# Patient Record
Sex: Female | Born: 1960 | Race: Black or African American | Hispanic: No | Marital: Single | Smoking: Current every day smoker
Health system: Southern US, Community
[De-identification: ages and names within clinical notes are randomized; demographics above are authoritative.]

## PROBLEM LIST (undated history)

## (undated) DIAGNOSIS — D509 Iron deficiency anemia, unspecified: Secondary | ICD-10-CM

## (undated) DIAGNOSIS — F419 Anxiety disorder, unspecified: Secondary | ICD-10-CM

## (undated) DIAGNOSIS — Z923 Personal history of irradiation: Secondary | ICD-10-CM

## (undated) DIAGNOSIS — K219 Gastro-esophageal reflux disease without esophagitis: Secondary | ICD-10-CM

## (undated) DIAGNOSIS — D0511 Intraductal carcinoma in situ of right breast: Secondary | ICD-10-CM

## (undated) DIAGNOSIS — F32A Depression, unspecified: Secondary | ICD-10-CM

## (undated) DIAGNOSIS — F329 Major depressive disorder, single episode, unspecified: Secondary | ICD-10-CM

## (undated) DIAGNOSIS — C50919 Malignant neoplasm of unspecified site of unspecified female breast: Secondary | ICD-10-CM

## (undated) DIAGNOSIS — J381 Polyp of vocal cord and larynx: Secondary | ICD-10-CM

## (undated) HISTORY — DX: Polyp of vocal cord and larynx: J38.1

## (undated) HISTORY — PX: BREAST BIOPSY: SHX20

## (undated) HISTORY — DX: Intraductal carcinoma in situ of right breast: D05.11

## (undated) HISTORY — DX: Gastro-esophageal reflux disease without esophagitis: K21.9

## (undated) HISTORY — DX: Iron deficiency anemia, unspecified: D50.9

## (undated) HISTORY — DX: Depression, unspecified: F32.A

## (undated) HISTORY — DX: Major depressive disorder, single episode, unspecified: F32.9

---

## 2006-12-27 ENCOUNTER — Ambulatory Visit: Payer: Self-pay | Admitting: Endocrinology

## 2007-01-13 ENCOUNTER — Ambulatory Visit: Payer: Self-pay | Admitting: Endocrinology

## 2007-10-28 IMAGING — MG UNKNOWN MG STUDY
1 series · 8 of 8 positions shown · non-contrast
Comparison: none

REASON FOR EXAM: Right breast calcifications
COMMENTS:

[R XCCL · right · 8 of 11 slices shown]
[im 1/11]
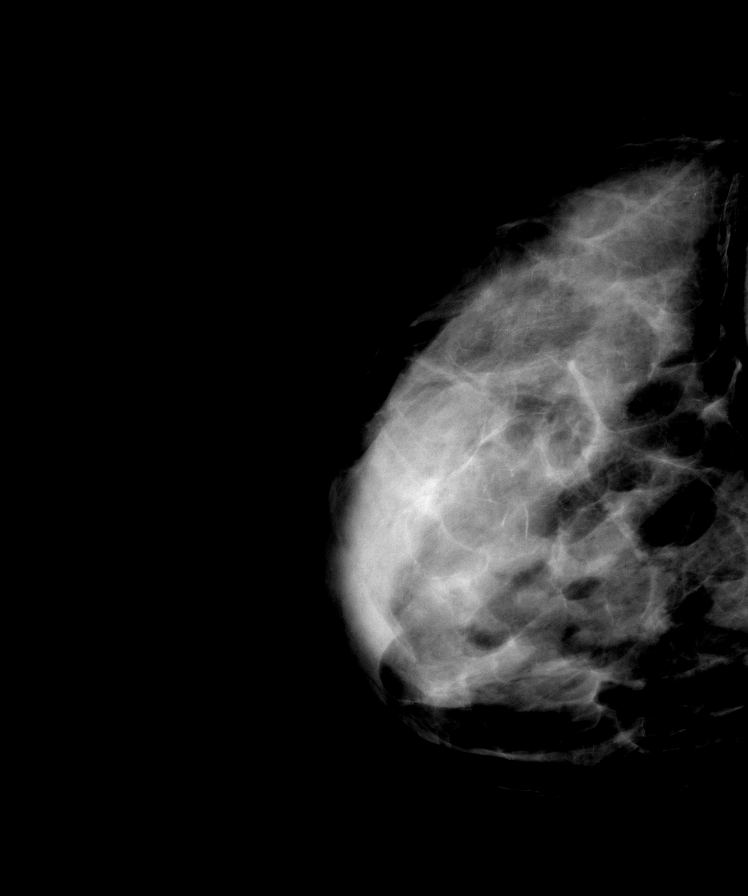
[im 2/11]
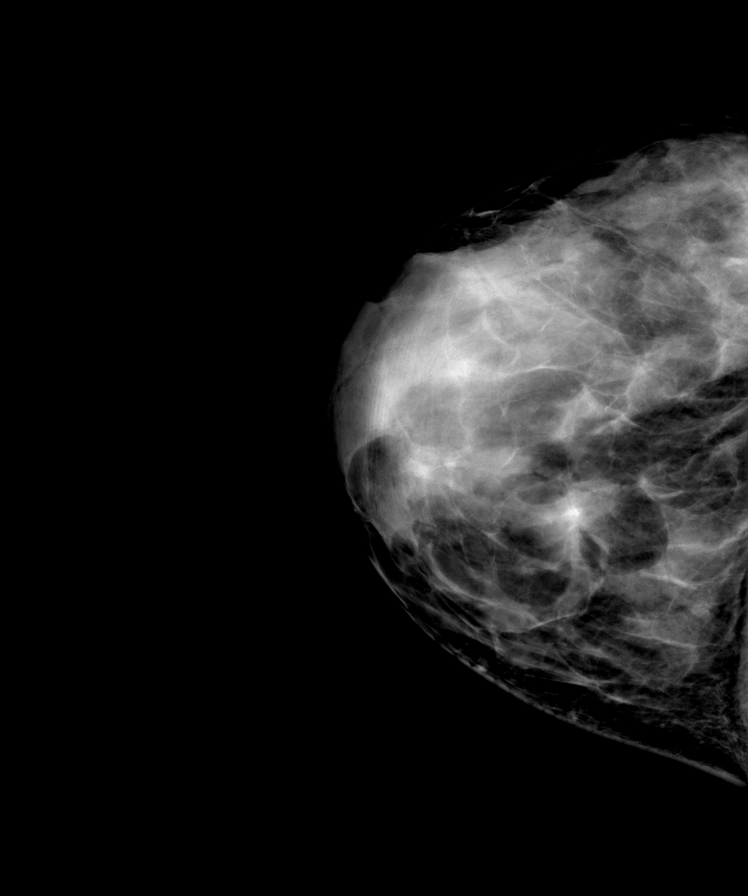
[im 3/11]
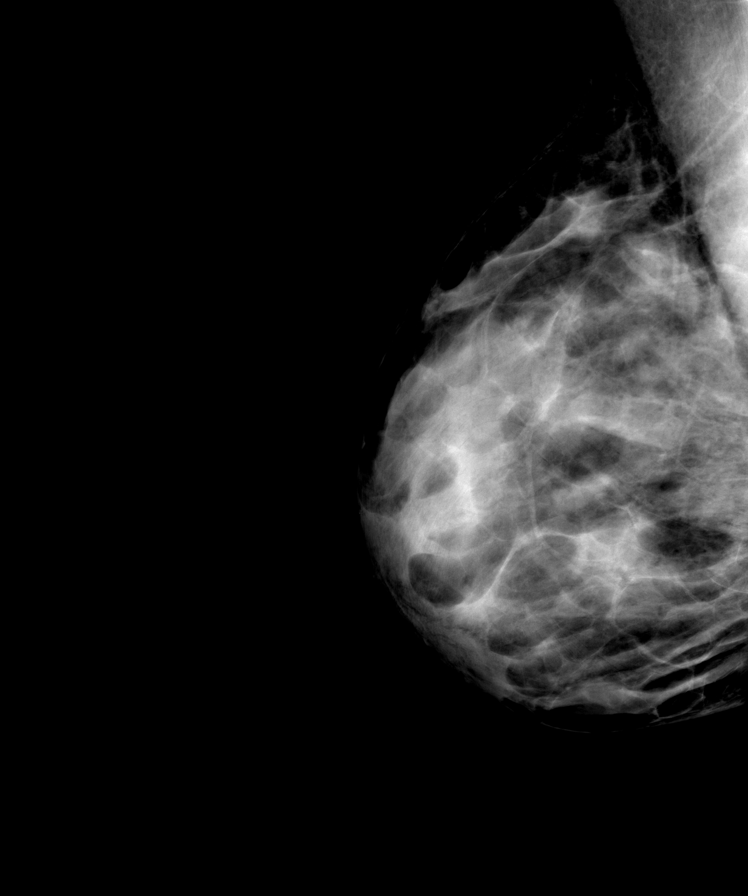
[im 5/11]
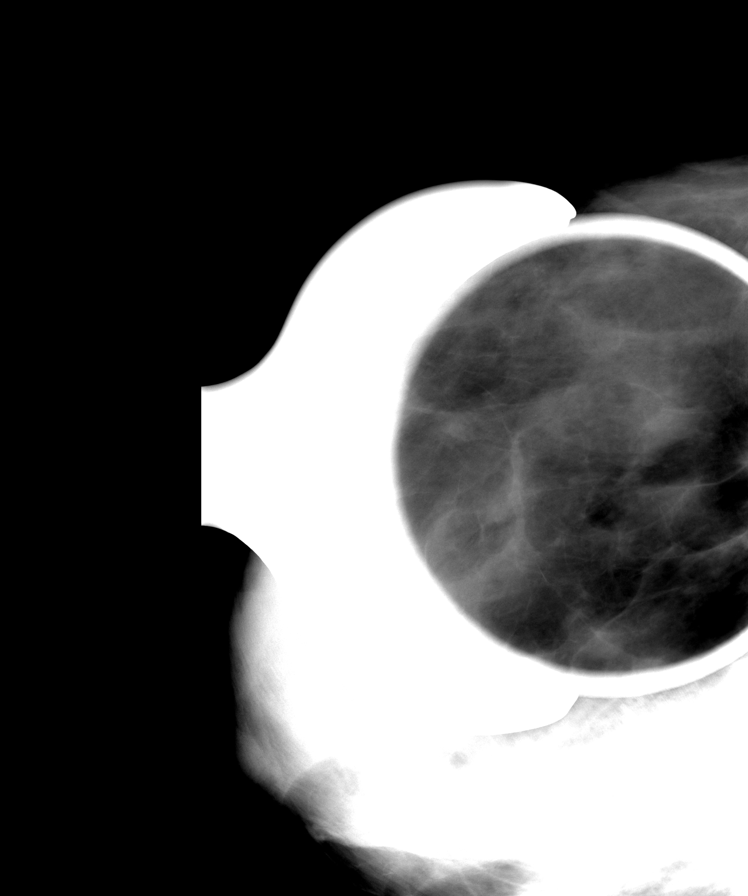
[im 6/11]
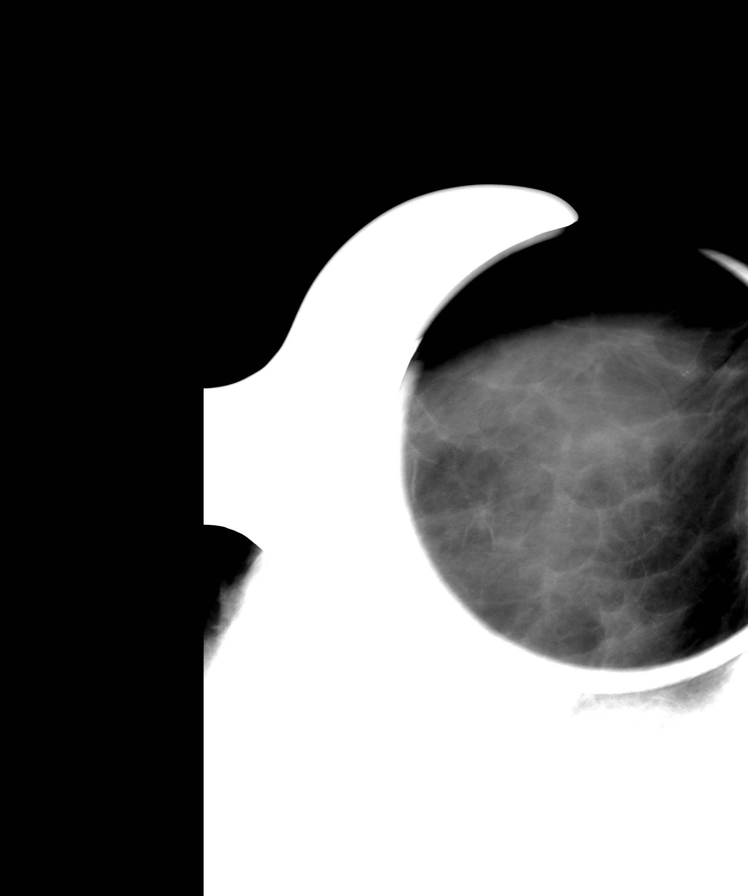
[im 8/11]
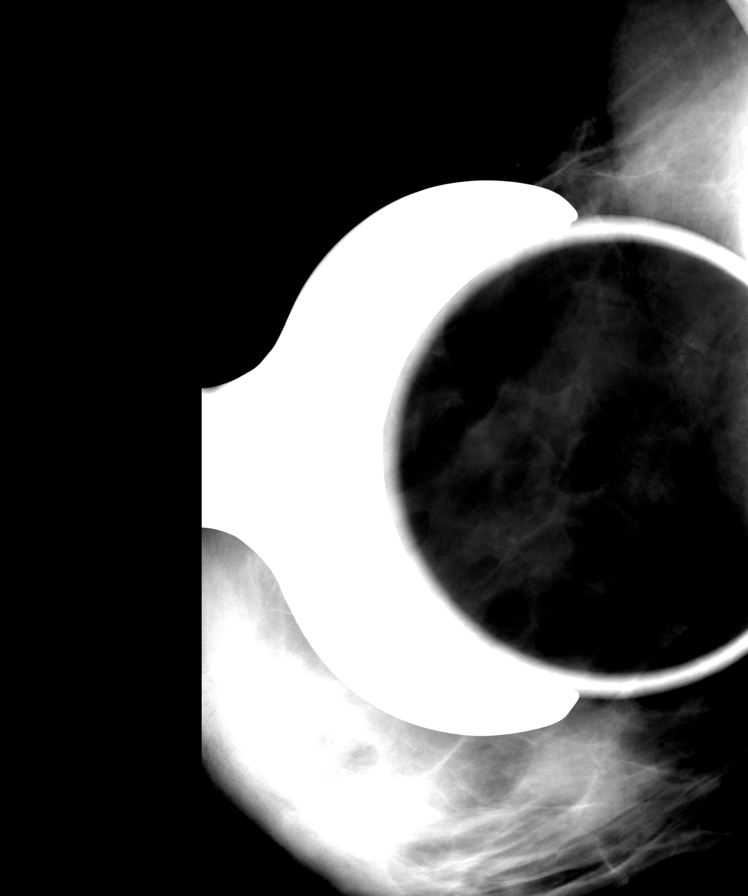
[im 9/11]
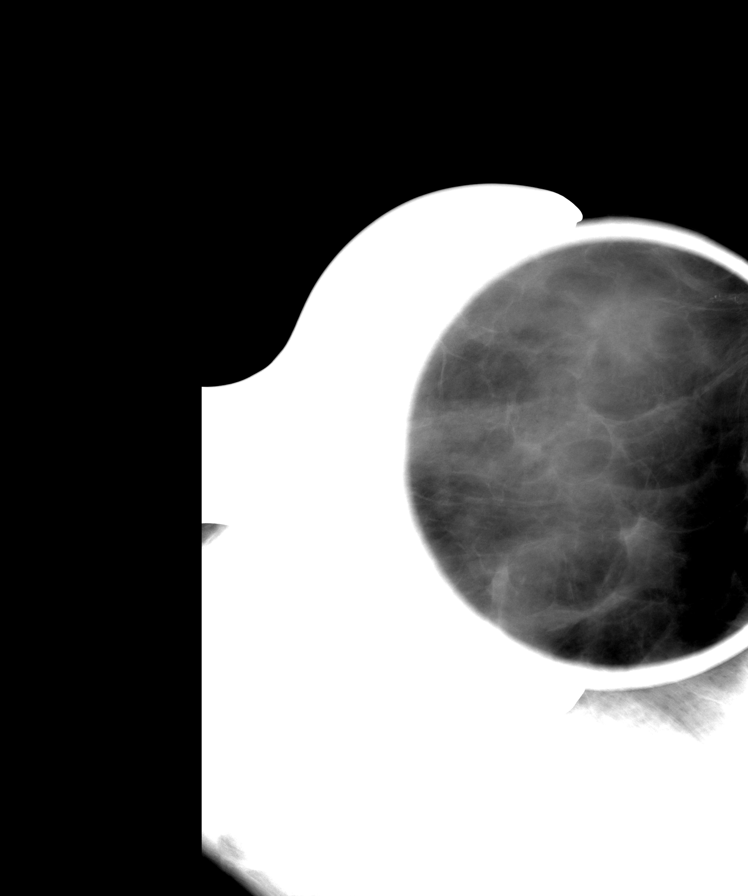
[im 11/11]
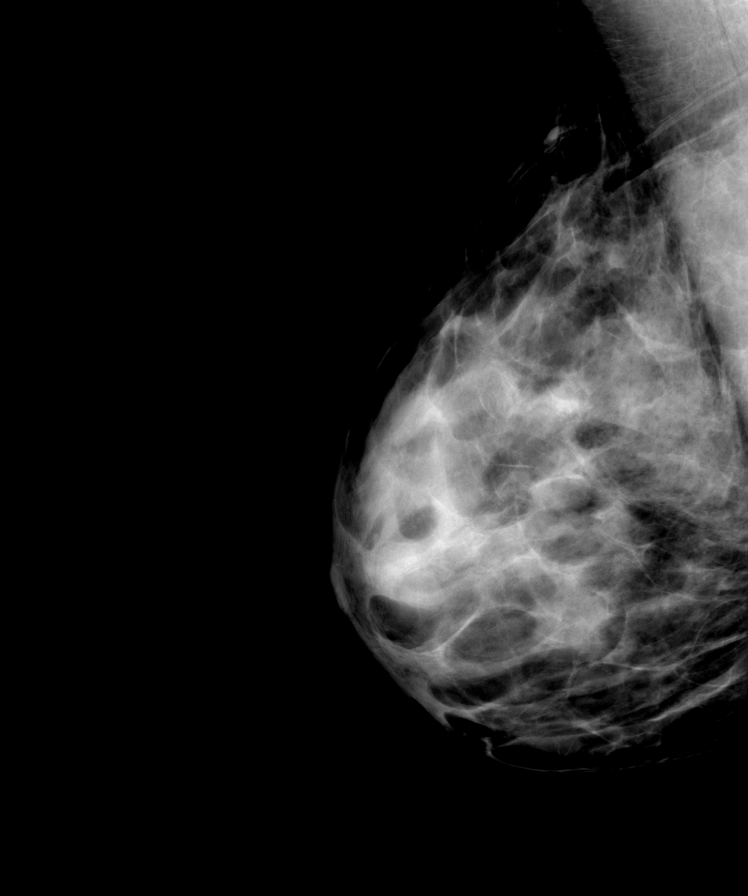

[8 of 8 positions shown; findings below may reference images not displayed]

PROCEDURE:     MAM - MAM DIG ADDVIEWS RT SCR  - January 13, 2007  [DATE]

RESULT:     On the patient's screening study [DATE] faint
microcalcifications were seen on the MLO view only projecting over the
pectoral muscle. On a true lateral film today, these are faintly visible. A
spot compression MLO view does reveal these to good advantage. On the CC
view, the calcifications are demonstrated quite laterally on spot films and
on an exaggerated film.
IMPRESSION: In the very lateral upper aspect of the RIGHT breast, there
are indeterminate microcalcifications. They are indeterminate in nature.
Surgical consultation is recommended.

BI-RADS: Category 4 - Suspicious Abnormality. Surgical consultation with an
eye toward either close interval three to six month follow-up mammography
versus wire localization and open biopsy is recommended. The
microcalcifications are too deep to be considered for Stereotactic Biopsy.

Thank you for this opportunity to contribute to the care of your patient.

A NEGATIVE MAMMOGRAM REPORT DOES NOT PRECLUDE BIOPSY OR OTHER EVALUATION OF
A CLINICALLY PALPABLE OR OTHERWISE SUSPICIOUS MASS OR LESION. BREAST CANCER
MAY NOT BE DETECTED BY MAMMOGRAPHY IN UP TO 10% OF CASES.

## 2007-12-01 HISTORY — PX: UPPER GI ENDOSCOPY: SHX6162

## 2010-08-26 DIAGNOSIS — K219 Gastro-esophageal reflux disease without esophagitis: Secondary | ICD-10-CM

## 2010-08-26 HISTORY — DX: Gastro-esophageal reflux disease without esophagitis: K21.9

## 2015-12-01 HISTORY — PX: COLONOSCOPY: SHX174

## 2016-03-16 ENCOUNTER — Other Ambulatory Visit: Payer: Self-pay | Admitting: Family Medicine

## 2016-03-16 DIAGNOSIS — R922 Inconclusive mammogram: Secondary | ICD-10-CM

## 2016-04-08 ENCOUNTER — Other Ambulatory Visit: Payer: Self-pay

## 2016-04-08 ENCOUNTER — Ambulatory Visit: Payer: Self-pay

## 2016-04-17 ENCOUNTER — Ambulatory Visit
Admission: RE | Admit: 2016-04-17 | Discharge: 2016-04-17 | Disposition: A | Discharge status: Home / Self Care | Payer: BLUE CROSS/BLUE SHIELD | Source: Ambulatory Visit | Attending: Family Medicine | Admitting: Family Medicine

## 2016-04-17 DIAGNOSIS — R922 Inconclusive mammogram: Secondary | ICD-10-CM | POA: Diagnosis present

## 2016-05-29 ENCOUNTER — Ambulatory Visit
Admission: RE | Admit: 2016-05-29 | Discharge: 2016-05-29 | Disposition: A | Discharge status: Home Health | Payer: BLUE CROSS/BLUE SHIELD | Source: Ambulatory Visit | Attending: Gastroenterology | Admitting: Gastroenterology

## 2016-05-29 ENCOUNTER — Ambulatory Visit: Payer: BLUE CROSS/BLUE SHIELD | Admitting: Anesthesiology

## 2016-05-29 ENCOUNTER — Encounter
Admission: RE | Disposition: A | Discharge status: Home Health | Payer: Self-pay | Source: Ambulatory Visit | Attending: Gastroenterology

## 2016-05-29 ENCOUNTER — Encounter: Payer: Self-pay | Admitting: *Deleted

## 2016-05-29 DIAGNOSIS — K621 Rectal polyp: Secondary | ICD-10-CM | POA: Insufficient documentation

## 2016-05-29 DIAGNOSIS — Z885 Allergy status to narcotic agent status: Secondary | ICD-10-CM | POA: Diagnosis not present

## 2016-05-29 DIAGNOSIS — K5730 Diverticulosis of large intestine without perforation or abscess without bleeding: Secondary | ICD-10-CM | POA: Diagnosis not present

## 2016-05-29 DIAGNOSIS — K635 Polyp of colon: Secondary | ICD-10-CM | POA: Diagnosis not present

## 2016-05-29 DIAGNOSIS — Z1211 Encounter for screening for malignant neoplasm of colon: Secondary | ICD-10-CM | POA: Insufficient documentation

## 2016-05-29 DIAGNOSIS — D125 Benign neoplasm of sigmoid colon: Secondary | ICD-10-CM | POA: Insufficient documentation

## 2016-05-29 DIAGNOSIS — F17200 Nicotine dependence, unspecified, uncomplicated: Secondary | ICD-10-CM | POA: Insufficient documentation

## 2016-05-29 SURGERY — COLONOSCOPY WITH PROPOFOL
Anesthesia: General

## 2016-05-29 MED ORDER — PROPOFOL 500 MG/50ML IV EMUL
INTRAVENOUS | Status: DC | PRN
Start: 2016-05-29 — End: 2016-05-29
  Administered 2016-05-29: 140 ug/kg/min via INTRAVENOUS

## 2016-05-29 MED ORDER — MIDAZOLAM HCL 2 MG/2ML IJ SOLN
INTRAMUSCULAR | Status: DC | PRN
  Administered 2016-05-29: 1 mg via INTRAVENOUS

## 2016-05-29 MED ORDER — SODIUM CHLORIDE 0.9 % IV SOLN
INTRAVENOUS | Status: DC
  Administered 2016-05-29: 1000 mL via INTRAVENOUS

## 2016-05-29 MED ORDER — PROPOFOL 10 MG/ML IV BOLUS
INTRAVENOUS | Status: DC | PRN
  Administered 2016-05-29: 50 mg via INTRAVENOUS

## 2016-05-29 MED ORDER — FENTANYL CITRATE (PF) 100 MCG/2ML IJ SOLN
INTRAMUSCULAR | Status: DC | PRN
  Administered 2016-05-29: 50 ug via INTRAVENOUS

## 2016-05-29 MED ORDER — SODIUM CHLORIDE 0.9 % IV SOLN
INTRAVENOUS | Status: DC
Start: 2016-05-29 — End: 2016-05-29

## 2016-05-29 MED ORDER — LIDOCAINE 2% (20 MG/ML) 5 ML SYRINGE
INTRAMUSCULAR | Status: DC | PRN
  Administered 2016-05-29: 40 mg via INTRAVENOUS

## 2016-05-29 NOTE — H&P (Signed)
Outpatient short stay form Pre-procedure 05/29/2016 12:53 PM Lollie Sails MD  Primary Physician: Dr. Clide Deutscher  Reason for visit:  Screening colonoscopy  History of present illness:  Patient is a 55 year old female presenting today as above. She did tolerate her prep well. She takes no aspirin or blood thinning products.    Current facility-administered medications:  .  0.9 %  sodium chloride infusion, , Intravenous, Continuous, Lollie Sails, MD .  0.9 %  sodium chloride infusion, , Intravenous, Continuous, Lollie Sails, MD  No prescriptions prior to admission     Allergies  Allergen Reactions  . Morphine And Related      History reviewed. No pertinent past medical history.  Review of systems:      Physical Exam    Heart and lungs: Regular rate and rhythm without rub or gallop, lungs are bilaterally clear.    HEENT: Norm cephalic atraumatic eyes are anicteric    Other:     Pertinant exam for procedure: Nontender nondistended bowel sounds positive normoactive.    Planned proceedures: Colonoscopy and indicated procedures. I have discussed the risks benefits and complications of procedures to include not limited to bleeding, infection, perforation and the risk of sedation and the patient wishes to proceed.    Lollie Sails, MD Gastroenterology 05/29/2016  12:53 PM

## 2016-05-29 NOTE — Op Note (Signed)
[facility] Gastroenterology Patient Name: Jennifer Rowe Procedure Date: 05/29/2016 1:00 PM MRN: [id number] Account #: [id number] Date of Birth: [birth year] Admit Type: Outpatient Age: 55 Room: [facility] ENDO ROOM 1 Gender: Female Note Status: Finalized Procedure:            Colonoscopy Indications:          Screening for colorectal malignant neoplasm, This is                        the patient's first colonoscopy Providers:            Lollie Sails, MD Referring MD:         Edmonia Lynch. Aycock MD (Referring MD) Medicines:            Monitored Anesthesia Care Complications:        No immediate complications. Procedure:            Pre-Anesthesia Assessment:                       - ASA Grade Assessment: II - A patient with mild                        systemic disease.                       After obtaining informed consent, the colonoscope was                        passed under direct vision. Throughout the procedure,                        the patient's blood pressure, pulse, and oxygen                        saturations were monitored continuously. The                        Colonoscope was introduced through the anus and                        advanced to the the cecum, identified by appendiceal                        orifice and ileocecal valve. The colonoscopy was                        performed with moderate difficulty due to a tortuous                        colon. Successful completion of the procedure was aided                        by using manual pressure. The patient tolerated the                        procedure well. The quality of the bowel preparation                        was good. Findings:      A few small-mouthed diverticula were found in the sigmoid colon and  distal descending colon.      A 2 mm polyp was found in the rectum. The polyp was sessile. The polyp       was removed with a cold biopsy forceps. Resection and retrieval were   complete.      A 3 mm polyp was found in the distal sigmoid colon. The polyp was       sessile. The polyp was removed with a cold snare. Resection and       retrieval were complete.      Four sessile polyps were found in the distal sigmoid colon. The polyps       were 1 to 2 mm in size. These polyps were removed with a cold biopsy       forceps. Resection and retrieval were complete.      The retroflexed view of the distal rectum and anal verge was normal and       showed no anal or rectal abnormalities.      The digital rectal exam was normal. Impression:           - Diverticulosis in the sigmoid colon and in the distal                        descending colon.                       - One 2 mm polyp in the rectum, removed with a cold                        biopsy forceps. Resected and retrieved.                       - One 3 mm polyp in the distal sigmoid colon, removed                        with a cold snare. Resected and retrieved.                       - Four 1 to 2 mm polyps in the distal sigmoid colon,                        removed with a cold biopsy forceps. Resected and                        retrieved.                       - The distal rectum and anal verge are normal on                        retroflexion view. Recommendation:       - Discharge patient to home.                       - Telephone GI clinic for pathology results in 1 week. Procedure Code(s):    --- Professional ---                       [phone], Colonoscopy, flexible; with removal of tumor(s),  polyp(s), or other lesion(s) by snare technique                       [id number], 59, Colonoscopy, flexible; with biopsy, single                        or multiple Diagnosis Code(s):    --- Professional ---                       Z12.11, Encounter for screening for malignant neoplasm                        of colon                       K62.1, Rectal polyp                       D12.5, Benign neoplasm of  sigmoid colon                       K57.30, Diverticulosis of large intestine without                        perforation or abscess without bleeding CPT copyright 2016 American Medical Association. All rights reserved. The codes documented in this report are preliminary and upon coder review may  be revised to meet current compliance requirements. Lollie Sails, MD 05/29/2016 1:37:17 PM This report has been signed electronically. Number of Addenda: 0 Note Initiated On: 05/29/2016 1:00 PM Scope Withdrawal Time: 0 hours 10 minutes 9 seconds  Total Procedure Duration: 0 hours 24 minutes 35 seconds       [facility]

## 2016-05-29 NOTE — Anesthesia Preprocedure Evaluation (Signed)
Anesthesia Evaluation  Patient identified by MRN, date of birth, ID band Patient awake    Reviewed: Allergy & Precautions, NPO status , Patient's Chart, lab work & pertinent test results, reviewed documented beta blocker date and time   Airway Mallampati: II  TM Distance: >3 FB     Dental  (+) Chipped   Pulmonary Current Smoker,           Cardiovascular      Neuro/Psych    GI/Hepatic   Endo/Other    Renal/GU      Musculoskeletal   Abdominal   Peds  Hematology   Anesthesia Other Findings   Reproductive/Obstetrics                             Anesthesia Physical Anesthesia Plan  ASA: II  Anesthesia Plan: General   Post-op Pain Management:    Induction: Intravenous  Airway Management Planned: Nasal Cannula  Additional Equipment:   Intra-op Plan:   Post-operative Plan:   Informed Consent: I have reviewed the patients History and Physical, chart, labs and discussed the procedure including the risks, benefits and alternatives for the proposed anesthesia with the patient or authorized representative who has indicated his/her understanding and acceptance.     Plan Discussed with: CRNA  Anesthesia Plan Comments:         Anesthesia Quick Evaluation

## 2016-05-29 NOTE — Anesthesia Procedure Notes (Signed)
Date/Time: 05/29/2016 1:00 PM Performed by: Allean Found Pre-anesthesia Checklist: Patient identified, Emergency Drugs available, Suction available, Patient being monitored and Timeout performed Patient Re-evaluated:Patient Re-evaluated prior to inductionOxygen Delivery Method: Nasal cannula Intubation Type: IV induction

## 2016-05-29 NOTE — Anesthesia Postprocedure Evaluation (Signed)
Anesthesia Post Note  Patient: Jennifer Rowe  Procedure(s) Performed: Procedure(s) (LRB): COLONOSCOPY WITH PROPOFOL (N/A)  Patient location during evaluation: Endoscopy Anesthesia Type: General Level of consciousness: awake and alert Pain management: pain level controlled Vital Signs Assessment: post-procedure vital signs reviewed and stable Respiratory status: spontaneous breathing, nonlabored ventilation, respiratory function stable and patient connected to nasal cannula oxygen Cardiovascular status: blood pressure returned to baseline and stable Postop Assessment: no signs of nausea or vomiting Anesthetic complications: no    Last Vitals:  Filed Vitals:   05/29/16 1401 05/29/16 1411  BP: 142/97 155/95  Pulse: 63 58  Temp:    Resp: 17 14    Last Pain: There were no vitals filed for this visit.               Culley Hedeen S

## 2016-05-29 NOTE — Transfer of Care (Signed)
Immediate Anesthesia Transfer of Care Note  Patient: Jennifer Rowe  Procedure(s) Performed: Procedure(s): COLONOSCOPY WITH PROPOFOL (N/A)  Patient Location: PACU  Anesthesia Type:General  Level of Consciousness: awake  Airway & Oxygen Therapy: Patient Spontanous Breathing and Patient connected to nasal cannula oxygen  Post-op Assessment: Report given to RN and Post -op Vital signs reviewed and stable  Post vital signs: Reviewed and stable  Last Vitals:  Filed Vitals:   05/29/16 1243 05/29/16 1341  BP: 113/49 112/66  Pulse: 93 66  Temp: 37.6 C 35.9 C  Resp: 20 18    Last Pain: There were no vitals filed for this visit.       Complications: No apparent anesthesia complications

## 2016-06-01 LAB — SURGICAL PATHOLOGY

## 2016-08-14 DIAGNOSIS — R196 Halitosis: Secondary | ICD-10-CM | POA: Insufficient documentation

## 2016-08-14 DIAGNOSIS — J381 Polyp of vocal cord and larynx: Secondary | ICD-10-CM | POA: Insufficient documentation

## 2016-08-14 HISTORY — DX: Polyp of vocal cord and larynx: J38.1

## 2017-11-30 DIAGNOSIS — Z923 Personal history of irradiation: Secondary | ICD-10-CM

## 2017-11-30 HISTORY — DX: Personal history of irradiation: Z92.3

## 2018-06-10 ENCOUNTER — Other Ambulatory Visit: Payer: Self-pay | Admitting: Family Medicine

## 2018-06-10 DIAGNOSIS — N6313 Unspecified lump in the right breast, lower outer quadrant: Secondary | ICD-10-CM

## 2018-06-15 ENCOUNTER — Ambulatory Visit
Admission: RE | Admit: 2018-06-15 | Discharge: 2018-06-15 | Disposition: A | Discharge status: Home / Self Care | Payer: BLUE CROSS/BLUE SHIELD | Source: Ambulatory Visit | Attending: Family Medicine | Admitting: Family Medicine

## 2018-06-15 DIAGNOSIS — N6313 Unspecified lump in the right breast, lower outer quadrant: Secondary | ICD-10-CM | POA: Diagnosis not present

## 2018-06-20 ENCOUNTER — Other Ambulatory Visit: Payer: Self-pay | Admitting: Family Medicine

## 2018-06-20 DIAGNOSIS — N6310 Unspecified lump in the right breast, unspecified quadrant: Secondary | ICD-10-CM

## 2018-06-20 DIAGNOSIS — R928 Other abnormal and inconclusive findings on diagnostic imaging of breast: Secondary | ICD-10-CM

## 2018-06-23 ENCOUNTER — Ambulatory Visit
Admission: RE | Admit: 2018-06-23 | Discharge: 2018-06-23 | Disposition: A | Discharge status: Home / Self Care | Payer: BLUE CROSS/BLUE SHIELD | Source: Ambulatory Visit | Attending: Family Medicine | Admitting: Family Medicine

## 2018-06-23 DIAGNOSIS — N6310 Unspecified lump in the right breast, unspecified quadrant: Secondary | ICD-10-CM | POA: Diagnosis present

## 2018-06-23 DIAGNOSIS — R928 Other abnormal and inconclusive findings on diagnostic imaging of breast: Secondary | ICD-10-CM | POA: Insufficient documentation

## 2018-06-27 ENCOUNTER — Encounter: Payer: Self-pay | Admitting: *Deleted

## 2018-06-27 LAB — SURGICAL PATHOLOGY

## 2018-06-27 NOTE — Progress Notes (Signed)
  Oncology Nurse Navigator Documentation  Navigator Location: [facility] (06/27/18 1600)   )Navigator Encounter Type: Introductory phone call (06/27/18 1600)   Abnormal Finding Date: 06/15/18 (06/27/18 1600) Confirmed Diagnosis Date: 06/24/18 (06/27/18 1600)                   Barriers/Navigation Needs: Education;Coordination of Care (06/27/18 1600)                          Time Spent with Patient: 15 (06/27/18 1600)   Left patient a message to return my call.  I would like to establish navigation services and help with coordination of care to make surgical and oncology consults.

## 2018-06-28 ENCOUNTER — Encounter: Payer: Self-pay | Admitting: *Deleted

## 2018-06-28 NOTE — Progress Notes (Signed)
Patient returned my call and is aware of her appointments.  Will [employer] Educational literature to patient.

## 2018-06-28 NOTE — Progress Notes (Signed)
  Oncology Nurse Navigator Documentation  Navigator Location: [facility] (06/28/18 1000) Referral date to RadOnc/MedOnc: 07/05/18 (06/28/18 1000) )Navigator Encounter Type: Telephone (06/28/18 1000) Telephone: Lahoma Crocker Call (06/28/18 1000)                       Barriers/Navigation Needs: Education;Coordination of Care (06/28/18 1000)                          Time Spent with Patient: 45 (06/28/18 1000)   The patient returned my call late yesterday afternoon.  She did not have a surgical or oncology preference.  I have scheduled her to see Dr. Rosana Hoes and Dr. Janese Banks on July 05, 2018 @ 9:30 and 11:00.  I have left her a message to return my call that I have her appointments scheduled.

## 2018-06-30 ENCOUNTER — Other Ambulatory Visit: Payer: Self-pay

## 2018-06-30 DIAGNOSIS — C50919 Malignant neoplasm of unspecified site of unspecified female breast: Secondary | ICD-10-CM

## 2018-06-30 HISTORY — DX: Malignant neoplasm of unspecified site of unspecified female breast: C50.919

## 2018-07-04 ENCOUNTER — Other Ambulatory Visit: Payer: Self-pay | Admitting: *Deleted

## 2018-07-05 ENCOUNTER — Inpatient Hospital Stay: Payer: BLUE CROSS/BLUE SHIELD | Attending: Oncology | Admitting: Oncology

## 2018-07-05 ENCOUNTER — Encounter: Payer: Self-pay | Admitting: Oncology

## 2018-07-05 ENCOUNTER — Encounter: Payer: Self-pay | Admitting: Surgery

## 2018-07-05 ENCOUNTER — Encounter: Payer: Self-pay | Admitting: *Deleted

## 2018-07-05 ENCOUNTER — Ambulatory Visit (INDEPENDENT_AMBULATORY_CARE_PROVIDER_SITE_OTHER): Payer: BLUE CROSS/BLUE SHIELD | Admitting: Surgery

## 2018-07-05 ENCOUNTER — Telehealth: Payer: Self-pay | Admitting: Surgery

## 2018-07-05 VITALS — BP 148/80 | HR 64 | Temp 98.7°F | Ht 67.0 in | Wt 156.4 lb

## 2018-07-05 VITALS — BP 122/77 | HR 82 | Temp 97.4°F | Resp 18 | Ht 67.0 in | Wt 156.1 lb

## 2018-07-05 DIAGNOSIS — F17210 Nicotine dependence, cigarettes, uncomplicated: Secondary | ICD-10-CM | POA: Diagnosis not present

## 2018-07-05 DIAGNOSIS — Z170 Estrogen receptor positive status [ER+]: Secondary | ICD-10-CM | POA: Insufficient documentation

## 2018-07-05 DIAGNOSIS — F329 Major depressive disorder, single episode, unspecified: Secondary | ICD-10-CM | POA: Diagnosis not present

## 2018-07-05 DIAGNOSIS — Z7189 Other specified counseling: Secondary | ICD-10-CM

## 2018-07-05 DIAGNOSIS — F419 Anxiety disorder, unspecified: Secondary | ICD-10-CM | POA: Diagnosis not present

## 2018-07-05 DIAGNOSIS — Z79899 Other long term (current) drug therapy: Secondary | ICD-10-CM | POA: Insufficient documentation

## 2018-07-05 DIAGNOSIS — D0511 Intraductal carcinoma in situ of right breast: Secondary | ICD-10-CM | POA: Insufficient documentation

## 2018-07-05 NOTE — Patient Instructions (Signed)
We will refer you to Medical Oncologist so you and the doctor determine what will occur next.  We have spoken today about removing a lump in your breast. This will be done on 07/08/2018 by Dr. Tama High at [facility].  You will most likely be able to leave the hospital several hours after your surgery. Rarely, a patient needs to stay over night but this is a possibility.  Plan to tenatively be off work for 1-2 weeks following the surgery and may return with approximately 4 more weeks of a lifting restriction, no greater than 15 lbs.    Lumpectomy A lumpectomy is a form of "breast conserving" or "breast preservation" surgery. It may also be referred to as a partial mastectomy. During a lumpectomy, the portion of the breast that contains the cancerous tumor or breast mass (the lump) is removed. Some normal tissue around the lump may also be removed to make sure all of the tumor has been removed.  LET [facility] CARE PROVIDER KNOW ABOUT:  Any allergies you have.  All medicines you are taking, including vitamins, herbs, eye drops, creams, and over-the-counter medicines.  Previous problems you or members of your family have had with the use of anesthetics.  Any blood disorders you have.  Previous surgeries you have had.  Medical conditions you have. RISKS AND COMPLICATIONS Generally, this is a safe procedure. However, problems can occur and include:  Bleeding.  Infection.  Pain.  Temporary swelling.  Change in the shape of the breast, particularly if a large portion is removed. BEFORE THE PROCEDURE  Ask your health care provider about changing or stopping your regular medicines. This is especially important if you are taking diabetes medicines or blood thinners.  Do not eat or drink anything after midnight on the night before the procedure or as directed by your health care provider. Ask your health care provider if you can take a sip of water with any approved medicines.  On the  day of surgery, your health care provider will use a mammogram or ultrasound to locate and mark the tumor in your breast. These markings on your breast will show where the cut (incision) will be made. PROCEDURE   An IV tube will be put into one of your veins.  You may be given medicine to help you relax before the surgery (sedative). You will be given one of the following:  A medicine that numbs the area (local anesthetic).  A medicine that makes you fall asleep (general anesthetic).  Your health care provider will use a kind of electric scalpel that uses heat to minimize bleeding (electrocautery knife).  A curved incision (like a smile or frown) that follows the natural curve of your breast is made, to allow for minimal scarring and better healing.  The tumor will be removed with some of the surrounding tissue. This will be sent to the lab for analysis. Your health care provider may also remove your lymph nodes at this time if needed.  Sometimes, but not always, a rubber tube called a drain will be surgically inserted into your breast area or armpit to collect excess fluid that may accumulate in the space where the tumor was. This drain is connected to a plastic bulb on the outside of your body. This drain creates suction to help remove the fluid.  The incisions will be closed with stitches (sutures).  A bandage may be placed over the incisions. AFTER THE PROCEDURE  You will be taken to the recovery  area.  You will be given medicine for pain.  A small rubber drain may be placed in the breast for 2-3 days to prevent a collection of blood (hematoma) from developing in the breast. You will be given instructions on caring for the drain before you go home.  A pressure bandage (dressing) will be applied for 1-2 days to prevent bleeding. Ask your health care provider how to care for your bandage at home.   This information is not intended to replace advice given to you by your health care  provider. Make sure you discuss any questions you have with your health care provider.   Document Released: 12/28/2006 Document Revised: 12/07/2014 Document Reviewed: 04/21/2013 Elsevier Interactive Patient Education [employer].

## 2018-07-05 NOTE — Progress Notes (Signed)
Surgical Clinic History and Physical  Referring provider:  Center, [address] [place], [place] [address]  HISTORY OF PRESENT ILLNESS (HPI):  57 y.o. female presents for evaluation of Right breast mass. Patient reports she first appreciated the mass on monthly self-exam 3 weeks ago, which prompted presentation to her primary care physician, who ordered mammogram and ultrasound, followed by core needle biopsy. Patient denies any known family history of breast cancer, denies any prior abnormal imaging studies or biopsies, and denies any abnormal nipple discharge, breast pain, or skin changes. She has recently started menopause with periodic hot flashes and altered mood for which her primary care physician prescribed venlafaxine, gave birth to one child, who she did not breast feed. Patient denies any fever/chills, unintentional weight loss, or N/V and states she is able to ambulate and ascend/descend steps without CP or SOB despite chronic ongoing tobacco abuse (smoking).  PAST MEDICAL HISTORY (PMH):  Past Medical History:  Diagnosis Date  . Depression   . Gastro-esophageal reflux disease without esophagitis 08/26/2010  . Iron deficiency anemia   . 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 No pertinent surgical history.   MEDICATIONS:  Prior to Admission medications   Medication Sig Start Date End Date Taking? Authorizing Provider  ferrous sulfate 325 (65 FE) MG EC tablet FERROUS SULFATE 325 (65 Fe) MG TBEC 03/05/16  Yes [provider]  venlafaxine (EFFEXOR) 37.5 MG tablet Take 37.5 mg by mouth 2 (two) times daily.   Yes [provider]     ALLERGIES:  No Active Allergies   SOCIAL HISTORY:  Social History   Socioeconomic History  . Marital status: Single    Spouse name: Not on file  . Number of children: Not on file  . Years of education: Not on file  . Highest education level: Not  on file  Occupational History  . Not on file  Social Needs  . Financial resource strain: Not on file  . Food insecurity:    Worry: Not on file    Inability: Not on file  . Transportation needs:    Medical: Not on file    Non-medical: Not on file  Tobacco Use  . Smoking status: Current Every Day Smoker    Types: Cigarettes  . Smokeless tobacco: Never Used  . Tobacco comment: 1 pack per week  Substance and Sexual Activity  . Alcohol use: Yes    Comment: rarely-social  . Drug use: Never  . Sexual activity: Not on file  Lifestyle  . Physical activity:    Days per week: Not on file    Minutes per session: Not on file  . Stress: Not on file  Relationships  . Social connections:    Talks on phone: Not on file    Gets together: Not on file    Attends religious service: Not on file    Active member of club or organization: Not on file    Attends meetings of clubs or organizations: Not on file    Relationship status: Not on file  . Intimate partner violence:    Fear of current or ex partner: Not on file    Emotionally abused: Not on file    Physically abused: Not on file    Forced sexual activity: Not on file  Other Topics Concern  . Not on file  Social History Narrative  . Not on file    The patient  currently resides (home / rehab facility / nursing home): Home The patient normally is (ambulatory / bedbound): Ambulatory  FAMILY HISTORY:  Family History  Problem Relation Age of Onset  . Breast cancer Neg Hx   . Cancer Neg Hx     Otherwise negative/non-contributory.  REVIEW OF SYSTEMS:  Constitutional: denies any other weight loss, fever, chills, or sweats  Eyes: denies any other vision changes, history of eye injury  ENT: denies sore throat, hearing problems  Respiratory: denies shortness of breath, wheezing  Cardiovascular: denies chest pain, palpitations Breasts: pain and masses as per HPI Gastrointestinal: denies abdominal pain, N/V, or  diarrhea Musculoskeletal: denies any other joint pains or cramps  Skin: Denies any other rashes or skin discolorations Neurological: denies any other headache, dizziness, weakness  Psychiatric: Denies any other depression, anxiety   All other review of systems were otherwise negative   VITAL SIGNS:  @VSRANGES @     Height: 5\' 7"  (170.2 cm) Weight: 156 lb 6.4 oz (70.9 kg) BMI (Calculated): 24.49   PHYSICAL EXAM:  Constitutional:  -- Normal body habitus  -- Awake, alert, and oriented x3  Eyes:  -- Pupils equally round and reactive to light  -- No scleral icterus  Ear, nose, throat:  -- No jugular venous distension -- No nasal drainage, bleeding Pulmonary:  -- No crackles  -- Equal breath sounds bilaterally -- Breathing non-labored at rest Cardiovascular:  -- S1, S2 present  -- No pericardial rubs  Breasts: -- Easily palpable non-tender and mobile 4 cm Right breast mass extending from under the Right lateral nipple to 3 - 4 cm lateral (9 o'clock position) relative to the patient's Right nipple, B/L no nipple discharge and B/L no palpable axillary lymphadenopathy, no superficial skin changes Gastrointestinal:  -- Abdomen soft, nontender, non-distended, no guarding/rebound  -- No abdominal masses appreciated, pulsatile or otherwise  Musculoskeletal and Integumentary:  -- Wounds or skin discoloration: None appreciated -- Extremities: B/L UE and LE FROM, hands and feet warm, no edema  Neurologic:  -- Motor function: Intact and symmetric -- Sensation: Intact and symmetric  Labs: CBC: No results found for: WBC, RBC BMP: No results found for: GLUCOSE, POTASSIUM, CHLORIDE, CO2, BUN, CREATININE, CALCIUM   Imaging studies:  Diagnostic Mammography with Focused Breast Ultrasound (06/15/2018) - personally reviewed and discussed with patient There is no suspicious mass in either breast on mammography. In the lateral aspect of the right breast there are coarse calcifications spanning an area  of 1.8 cm. They have a dystrophic appearance but are in a ductal distribution. There are no malignant type microcalcifications in the left breast.  Targeted ultrasound showed an asymmetric hypoechoic mass with punctate echogenic foci (corresponding with calcifications seen mammographically). The area measures 4.0 x 1.2 x 2.7 cm. Sonographic evaluation the right axilla does not show any enlarged adenopathy.   Assessment/Plan:  57 y.o. 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   - imaging and pathology results discussed  - all risks, benefits, and alternatives to Right lumpectomy with subsequent radiation +/- sentinel lymph node biopsy vs Right mastectomy with sentinel lymph node biopsy were discussed with the patient (specifically including, but not limited to, bleeding, infection, seroma, anticipated breast asymmetry due to size of mass vs size of breasts, possible subsequent sentinel lymph node biopsy if invasive cancer found on final pathology, and possible need for re-excision), all of her  questions were answered to her expressed satisfaction, patient expresses she wishes to proceed, and informed consent was obtained.  - will plan for Right breast lumpectomy without SLN biopsy this Friday, 8/9 with anticipated post-surgical radiation  - anticipate return to clinic 2 weeks after above scheduled procedure  - patient scheduled to see oncologist Dr. Janese Banks today  - instructed to call if any questions or concerns  - smoking cessation encouraged  All of the above recommendations were discussed with the patient, and all of patient's questions were answered to her expressed satisfaction.  Thank you for the opportunity to participate in this patient's care.  -- Marilynne Drivers Rosana Hoes, MD, [place]: [facility] - Partnering for exceptional care. Office: [phone]

## 2018-07-05 NOTE — Progress Notes (Signed)
  Oncology Nurse Navigator Documentation  Navigator Location: [facility]Med Onc (07/05/18 1400) Referral date to RadOnc/MedOnc: 07/05/18 (07/05/18 1400) )Navigator Encounter Type: Initial MedOnc (07/05/18 1400)                     Patient Visit Type: MedOnc (07/05/18 1400) Treatment Phase: Pre-Tx/Tx Discussion (07/05/18 1400) Barriers/Navigation Needs: Education (07/05/18 1400) Education: Newly Diagnosed Cancer Education (07/05/18 1400) Interventions: Education (07/05/18 1400)     Education Method: Verbal;Written (07/05/18 1400)  Support Groups/Services: Breast Support Group (07/05/18 1400)             Time Spent with Patient: 30 (07/05/18 1400)   Met patient today prior to her initial medical oncology appointment with Dr. Janese Banks.  Gave patient breast cancer educational literature, "My Breast Cancer Treatment Handbook" by Josephine Igo, RN.  She is to call with any questions or needs.

## 2018-07-05 NOTE — H&P (View-Only) (Signed)
Surgical Clinic History and Physical  Referring provider:  [facility], [address] [place], [place] [address]  HISTORY OF PRESENT ILLNESS (HPI):  57 y.o. female presents for evaluation of Right breast mass. Patient reports she first appreciated the mass on monthly self-exam 3 weeks ago, which prompted presentation to her primary care physician, who ordered mammogram and ultrasound, followed by core needle biopsy. Patient denies any known family history of breast cancer, denies any prior abnormal imaging studies or biopsies, and denies any abnormal nipple discharge, breast pain, or skin changes. She has recently started menopause with periodic hot flashes and altered mood for which her primary care physician prescribed venlafaxine, gave birth to one child, who she did not breast feed. Patient denies any fever/chills, unintentional weight loss, or N/V and states she is able to ambulate and ascend/descend steps without CP or SOB despite chronic ongoing tobacco abuse (smoking).  PAST MEDICAL HISTORY (PMH):  Past Medical History:  Diagnosis Date  . Depression   . Gastro-esophageal reflux disease without esophagitis 08/26/2010  . Iron deficiency anemia   . 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 No pertinent surgical history.   MEDICATIONS:  Prior to Admission medications   Medication Sig Start Date End Date Taking? Authorizing Provider  ferrous sulfate 325 (65 FE) MG EC tablet FERROUS SULFATE 325 (65 Fe) MG TBEC 03/05/16  Yes [provider]  venlafaxine (EFFEXOR) 37.5 MG tablet Take 37.5 mg by mouth 2 (two) times daily.   Yes [provider]     ALLERGIES:  No Active Allergies   SOCIAL HISTORY:  Social History   Socioeconomic History  . Marital status: Single    Spouse name: Not on file  . Number of children: Not on file  . Years of education: Not on file  . Highest education level: Not  on file  Occupational History  . Not on file  Social Needs  . Financial resource strain: Not on file  . Food insecurity:    Worry: Not on file    Inability: Not on file  . Transportation needs:    Medical: Not on file    Non-medical: Not on file  Tobacco Use  . Smoking status: Current Every Day Smoker    Types: Cigarettes  . Smokeless tobacco: Never Used  . Tobacco comment: 1 pack per week  Substance and Sexual Activity  . Alcohol use: Yes    Comment: rarely-social  . Drug use: Never  . Sexual activity: Not on file  Lifestyle  . Physical activity:    Days per week: Not on file    Minutes per session: Not on file  . Stress: Not on file  Relationships  . Social connections:    Talks on phone: Not on file    Gets together: Not on file    Attends religious service: Not on file    Active member of club or organization: Not on file    Attends meetings of clubs or organizations: Not on file    Relationship status: Not on file  . Intimate partner violence:    Fear of current or ex partner: Not on file    Emotionally abused: Not on file    Physically abused: Not on file    Forced sexual activity: Not on file  Other Topics Concern  . Not on file  Social History Narrative  . Not on file    The patient  currently resides (home / rehab facility / nursing home): Home The patient normally is (ambulatory / bedbound): Ambulatory  FAMILY HISTORY:  Family History  Problem Relation Age of Onset  . Breast cancer Neg Hx   . Cancer Neg Hx     Otherwise negative/non-contributory.  REVIEW OF SYSTEMS:  Constitutional: denies any other weight loss, fever, chills, or sweats  Eyes: denies any other vision changes, history of eye injury  ENT: denies sore throat, hearing problems  Respiratory: denies shortness of breath, wheezing  Cardiovascular: denies chest pain, palpitations Breasts: pain and masses as per HPI Gastrointestinal: denies abdominal pain, N/V, or  diarrhea Musculoskeletal: denies any other joint pains or cramps  Skin: Denies any other rashes or skin discolorations Neurological: denies any other headache, dizziness, weakness  Psychiatric: Denies any other depression, anxiety   All other review of systems were otherwise negative   VITAL SIGNS:  @VSRANGES @     Height: 5\' 7"  (170.2 cm) Weight: 156 lb 6.4 oz (70.9 kg) BMI (Calculated): 24.49   PHYSICAL EXAM:  Constitutional:  -- Normal body habitus  -- Awake, alert, and oriented x3  Eyes:  -- Pupils equally round and reactive to light  -- No scleral icterus  Ear, nose, throat:  -- No jugular venous distension -- No nasal drainage, bleeding Pulmonary:  -- No crackles  -- Equal breath sounds bilaterally -- Breathing non-labored at rest Cardiovascular:  -- S1, S2 present  -- No pericardial rubs  Breasts: -- Easily palpable non-tender and mobile 4 cm Right breast mass extending from under the Right lateral nipple to 3 - 4 cm lateral (9 o'clock position) relative to the patient's Right nipple, B/L no nipple discharge and B/L no palpable axillary lymphadenopathy, no superficial skin changes Gastrointestinal:  -- Abdomen soft, nontender, non-distended, no guarding/rebound  -- No abdominal masses appreciated, pulsatile or otherwise  Musculoskeletal and Integumentary:  -- Wounds or skin discoloration: None appreciated -- Extremities: B/L UE and LE FROM, hands and feet warm, no edema  Neurologic:  -- Motor function: Intact and symmetric -- Sensation: Intact and symmetric  Labs: CBC: No results found for: WBC, RBC BMP: No results found for: GLUCOSE, POTASSIUM, CHLORIDE, CO2, BUN, CREATININE, CALCIUM   Imaging studies:  Diagnostic Mammography with Focused Breast Ultrasound (06/15/2018) - personally reviewed and discussed with patient There is no suspicious mass in either breast on mammography. In the lateral aspect of the right breast there are coarse calcifications spanning an area  of 1.8 cm. They have a dystrophic appearance but are in a ductal distribution. There are no malignant type microcalcifications in the left breast.  Targeted ultrasound showed an asymmetric hypoechoic mass with punctate echogenic foci (corresponding with calcifications seen mammographically). The area measures 4.0 x 1.2 x 2.7 cm. Sonographic evaluation the right axilla does not show any enlarged adenopathy.   Assessment/Plan:  57 y.o. 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   - imaging and pathology results discussed  - all risks, benefits, and alternatives to Right lumpectomy with subsequent radiation +/- sentinel lymph node biopsy vs Right mastectomy with sentinel lymph node biopsy were discussed with the patient (specifically including, but not limited to, bleeding, infection, seroma, anticipated breast asymmetry due to size of mass vs size of breasts, possible subsequent sentinel lymph node biopsy if invasive cancer found on final pathology, and possible need for re-excision), all of her  questions were answered to her expressed satisfaction, patient expresses she wishes to proceed, and informed consent was obtained.  - will plan for Right breast lumpectomy without SLN biopsy this Friday, 8/9 with anticipated post-surgical radiation  - anticipate return to clinic 2 weeks after above scheduled procedure  - patient scheduled to see oncologist Dr. Janese Banks today  - instructed to call if any questions or concerns  - smoking cessation encouraged  All of the above recommendations were discussed with the patient, and all of patient's questions were answered to her expressed satisfaction.  Thank you for the opportunity to participate in this patient's care.  -- Marilynne Drivers Rosana Hoes, MD, [place]: [facility] - Partnering for exceptional care. Office: [phone]

## 2018-07-05 NOTE — Telephone Encounter (Signed)
I have called patient to discuss surgery information below. No answer. I have left a message on voicemail with pre admission appt date and time and for patient to also call back for additional information.   pre op date/time and sx date. Sx: 07/08/18 with Dr Thermon Leyland breast lumpectomy.  Pre op: 07/06/18 @ 2:00pm--office interview  Patient will need to call [phone], between 1-3:00pm the day before surgery, to find out what time to arrive.

## 2018-07-06 ENCOUNTER — Encounter
Admission: RE | Admit: 2018-07-06 | Discharge: 2018-07-06 | Disposition: A | Discharge status: Home / Self Care | Payer: BLUE CROSS/BLUE SHIELD | Source: Ambulatory Visit | Attending: Surgery | Admitting: Surgery

## 2018-07-06 ENCOUNTER — Other Ambulatory Visit: Payer: Self-pay

## 2018-07-06 ENCOUNTER — Telehealth: Payer: Self-pay | Admitting: Surgery

## 2018-07-06 DIAGNOSIS — Z0000 Encounter for general adult medical examination without abnormal findings: Secondary | ICD-10-CM

## 2018-07-06 DIAGNOSIS — Z01812 Encounter for preprocedural laboratory examination: Secondary | ICD-10-CM | POA: Insufficient documentation

## 2018-07-06 HISTORY — DX: Anxiety disorder, unspecified: F41.9

## 2018-07-06 LAB — CBC WITH DIFFERENTIAL/PLATELET
Basophils Absolute: 0 10*3/uL (ref 0–0.1)
Basophils Relative: 1 %
EOS PCT: 3 %
Eosinophils Absolute: 0.2 10*3/uL (ref 0–0.7)
HCT: 37.8 % (ref 35.0–47.0)
Hemoglobin: 13.1 g/dL (ref 12.0–16.0)
LYMPHS PCT: 40 %
Lymphs Abs: 2.6 10*3/uL (ref 1.0–3.6)
MCH: 33.3 pg (ref 26.0–34.0)
MCHC: 34.7 g/dL (ref 32.0–36.0)
MCV: 96.1 fL (ref 80.0–100.0)
MONO ABS: 0.6 10*3/uL (ref 0.2–0.9)
Monocytes Relative: 9 %
Neutro Abs: 3.1 10*3/uL (ref 1.4–6.5)
Neutrophils Relative %: 47 %
PLATELETS: 233 10*3/uL (ref 150–440)
RBC: 3.94 MIL/uL (ref 3.80–5.20)
RDW: 13.2 % (ref 11.5–14.5)
WBC: 6.5 10*3/uL (ref 3.6–11.0)

## 2018-07-06 LAB — COMPREHENSIVE METABOLIC PANEL
ALT: 13 U/L (ref 0–44)
AST: 21 U/L (ref 15–41)
Albumin: 4.1 g/dL (ref 3.5–5.0)
Alkaline Phosphatase: 53 U/L (ref 38–126)
Anion gap: 9 (ref 5–15)
BUN: 8 mg/dL (ref 6–20)
CHLORIDE: 106 mmol/L (ref 98–111)
CO2: 27 mmol/L (ref 22–32)
CREATININE: 0.79 mg/dL (ref 0.44–1.00)
Calcium: 9.1 mg/dL (ref 8.9–10.3)
Glucose, Bld: 111 mg/dL — ABNORMAL HIGH (ref 70–99)
POTASSIUM: 3.4 mmol/L — AB (ref 3.5–5.1)
Sodium: 142 mmol/L (ref 135–145)
Total Bilirubin: 1 mg/dL (ref 0.3–1.2)
Total Protein: 7.4 g/dL (ref 6.5–8.1)

## 2018-07-06 NOTE — Telephone Encounter (Signed)
Surgery information given to patient

## 2018-07-06 NOTE — Patient Instructions (Addendum)
Your procedure is scheduled on: Friday 07/08/18.  Report to DAY SURGERY DEPARTMENT LOCATED ON 2ND FLOOR MEDICAL MALL ENTRANCE. To find out your arrival time please call [phone] between 1PM - 3PM on Thursday 07/07/18.  Remember: Instructions that are not followed completely may result in serious medical risk, up to and including death, or upon the discretion of your surgeon and anesthesiologist your surgery may need to be rescheduled.     _X__ 1. Do not eat food after midnight the night before your procedure.                 No gum chewing or hard candies. You may drink clear liquids up to 2 hours                 before you are scheduled to arrive for your surgery- DO not drink clear                 liquids within 2 hours of the start of your surgery.                 Clear Liquids include:  water, apple juice without pulp, clear carbohydrate                 drink such as Clearfast or Gatorade, Black Coffee or Tea (Do not add                 anything to coffee or tea).  __X__2.  On the morning of surgery brush your teeth with toothpaste and water, you may rinse your mouth with mouthwash if you wish.  Do not swallow and toothpaste of mouthwash.     _X__ 3.  No Alcohol for 24 hours before or after surgery.   _X__ 4.  Do Not Smoke or use e-cigarettes For 24 Hours Prior to Your Surgery.                 Do not use any chewable tobacco products for at least 6 hours prior to                 surgery.  ____  5.  Bring all medications with you on the day of surgery if instructed.   __X__  6.  Notify your doctor if there is any change in your medical condition      (cold, fever, infections).     Do not wear jewelry, make-up, hairpins, clips or nail polish. Do not wear lotions, powders, or perfumes.  Do not shave 48 hours prior to surgery. Men may shave face and neck. Do not bring valuables to the hospital.    [facility] is not responsible for any belongings or valuables.  Contacts,  dentures/partials or body piercings may not be worn into surgery. Bring a case for your contacts, glasses or hearing aids, a denture cup will be supplied. Leave your suitcase in the car. After surgery it may be brought to your room. For patients admitted to the hospital, discharge time is determined by your treatment team.   Patients discharged the day of surgery will not be allowed to drive home.   Please read over the following fact sheets that you were given:   MRSA Information  __X__ Take these medicines the morning of surgery with A SIP OF WATER:     1. esomeprazole (NEXIUM) 20 MG capsule  2. venlafaxine XR (EFFEXOR-XR) 37.5 MG 24 hr capsule  3.   4.  5.  6.  ____  Fleet Enema (as directed)   __X__ Use CHG Soap/SAGE wipes as directed   __X__ Stop Anti-inflammatories 7 days before surgery such as Advil, Ibuprofen, Motrin, BC or Goodies Powder, Naprosyn, Naproxen, Aleve, Aspirin, Meloxicam. May take Tylenol if needed for pain or discomfort.  DO NO TAKE ANY MORE ALEVE.   __X__ Stop all herbal supplements, fish oil or vitamin E until after surgery.

## 2018-07-06 NOTE — Telephone Encounter (Signed)
Patient came by the office and dropped off fmla paperwork to be filled out, payment has also been made and paperwork has been placed in folder up front.

## 2018-07-07 ENCOUNTER — Encounter: Payer: Self-pay | Admitting: Oncology

## 2018-07-07 DIAGNOSIS — D0511 Intraductal carcinoma in situ of right breast: Secondary | ICD-10-CM | POA: Insufficient documentation

## 2018-07-07 DIAGNOSIS — Z7189 Other specified counseling: Secondary | ICD-10-CM | POA: Insufficient documentation

## 2018-07-07 HISTORY — DX: Intraductal carcinoma in situ of right breast: D05.11

## 2018-07-07 MED ORDER — CEFAZOLIN SODIUM-DEXTROSE 2-4 GM/100ML-% IV SOLN
2.0000 g | INTRAVENOUS | Status: AC
  Administered 2018-07-08: 2 g via INTRAVENOUS

## 2018-07-07 NOTE — Progress Notes (Signed)
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 Clide Deutscher  Date of visit: 07/07/18   History of presenting illness-patient is a 57 year old female who self palpated a mass in her right breast which prompted a mammogram.Mammogram from 06/15/2018 showed an area of coarse calcifications spanning an area of 1.8 cm.  Ultrasound showed the area measured about 4 x 1.2 x 2.7 cm.  Sonographic evaluation of the right axilla does not reveal any enlarged adenopathy.  No suspicious findings in the left breast.  Core biopsy showed DCIS intermediate nuclear grade associated with calcification.  Features suggestive of a ruptured duct or cyst.  ER greater than 90% positive PR negative less than 1%.  Patient has met with Dr. Rosana Hoes and will be undergoing lumpectomy later this week.  Overall she is doing well and denies any complaints today.  Reports that her prior mammogram before this was in 2017.  No history of prior breast biopsies.  No family history of any breast cancer, colon pancreatic or ovarian cancer.  Menarche at the age of 27 which she has been postmenopausal for the past 6 to 7 years.  Denies any use of hormone contraception in the past.  She has 1 son.  ECOG PS- 0  Pain scale- 0   Review of systems- Review of Systems  Constitutional: Negative for chills, fever, malaise/fatigue and weight loss.  HENT: Negative for congestion, ear discharge and nosebleeds.   Eyes: Negative for blurred vision.  Respiratory: Negative for cough, hemoptysis, sputum production, shortness of breath and wheezing.   Cardiovascular: Negative for chest pain, palpitations, orthopnea and claudication.  Gastrointestinal: Negative for abdominal pain, blood in  stool, constipation, diarrhea, heartburn, melena, nausea and vomiting.  Genitourinary: Negative for dysuria, flank pain, frequency, hematuria and urgency.  Musculoskeletal: Negative for back pain, joint pain and myalgias.  Skin: Negative for rash.  Neurological: Negative for dizziness, tingling, focal weakness, seizures, weakness and headaches.  Endo/Heme/Allergies: Does not bruise/bleed easily.  Psychiatric/Behavioral: Negative for depression and suicidal ideas. The patient does not have insomnia.     No Known Allergies  Patient Active Problem List   Diagnosis Date Noted  . Halitosis 08/14/2016  . 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 Cigarette nicotine dependence, uncomplicated [id number]/41/6384  . Gastro-esophageal reflux disease without esophagitis 08/26/2010     Past Medical History:  Diagnosis Date  . Anxiety   . Depression   . Gastro-esophageal reflux disease without esophagitis 08/26/2010  . Iron deficiency anemia   . 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 COLONOSCOPY  2017  . UPPER GI ENDOSCOPY  2009    Social History   Socioeconomic History  . Marital status: Single    Spouse name: Not on file  . Number of children: Not on file  . Years of education: Not on file  . Highest education level: Not on file  Occupational History  . Not on file  Social Needs  . Financial resource strain: Not on file  . Food insecurity:    Worry: Not on file    Inability: Not on file  . Transportation needs:    Medical: Not on file    Non-medical: Not on file  Tobacco Use  .  Smoking status: Current Every Day Smoker    Types: Cigarettes  . Smokeless tobacco: Never Used  . Tobacco comment: 1 pack per week  Substance and Sexual Activity  . Alcohol use: Yes    Comment: rarely-social  . Drug use: Yes    Types: Marijuana  . Sexual activity: Not Currently  Lifestyle  . Physical activity:    Days per week: Not on file    Minutes per session:  Not on file  . Stress: Not on file  Relationships  . Social connections:    Talks on phone: Not on file    Gets together: Not on file    Attends religious service: Not on file    Active member of club or organization: Not on file    Attends meetings of clubs or organizations: Not on file    Relationship status: Not on file  . Intimate partner violence:    Fear of current or ex partner: Not on file    Emotionally abused: Not on file    Physically abused: Not on file    Forced sexual activity: Not on file  Other Topics Concern  . Not on file  Social History Narrative  . Not on file     Family History  Problem Relation Age of Onset  . Breast cancer Neg Hx   . Cancer Neg Hx      Current Outpatient Medications:  .  ferrous sulfate 325 (65 FE) MG EC tablet, Take 325 mg by mouth twice weekly, Disp: , Rfl:  .  diphenhydrAMINE (BENADRYL) 25 MG tablet, Take 25 mg by mouth daily as needed for allergies., Disp: , Rfl:  .  esomeprazole (NEXIUM) 20 MG capsule, Take 20 mg by mouth daily as needed (acid reflux)., Disp: , Rfl:  .  naproxen sodium (ALEVE) 220 MG tablet, Take 220 mg by mouth daily as needed (pain)., Disp: , Rfl:  .  venlafaxine XR (EFFEXOR-XR) 37.5 MG 24 hr capsule, Take 37.5 mg by mouth daily., Disp: , Rfl:    Physical exam:  Vitals:   07/05/18 1110  BP: 122/77  Pulse: 82  Resp: 18  Temp: (!) 97.4 F (36.3 C)  TempSrc: Tympanic  SpO2: 97%  Weight: 156 lb 1.6 oz (70.8 kg)  Height: _0  (1.702 m)   Physical Exam  Constitutional: She is oriented to person, place, and time.  She is thin and does not appear to be in any acute distress  HENT:  Head: Normocephalic and atraumatic.  Eyes: Pupils are equal, round, and reactive to light. EOM are normal.  Neck: Normal range of motion.  Cardiovascular: Normal rate, regular rhythm and normal heart sounds.  Pulmonary/Chest: Effort normal and breath sounds normal.  Abdominal: Soft. Bowel sounds are normal.  Neurological:  She is alert and oriented to person, place, and time.  Skin: Skin is warm and dry.  Palpable mass about 2 cm in size noted in the 9 o'clock position in the right breast 3 to 4 cm from the nipple.  No palpable right axillary adenopathy.  No palpable masses in the left breast or left axillary adenopathy.    CMP Latest Ref Rng & Units 07/06/2018  Glucose 70 - 99 mg/dL 111(H)  BUN 6 - 20 mg/dL 8  Creatinine 0.44 - 1.00 mg/dL 0.79  Sodium 135 - 145 mmol/L 142  Potassium 3.5 - 5.1 mmol/L 3.4(L)  Chloride 98 - 111 mmol/L 106  CO2 22 - 32 mmol/L 27  Calcium 8.9 - 10.3  mg/dL 9.1  Total Protein 6.5 - 8.1 g/dL 7.4  Total Bilirubin 0.3 - 1.2 mg/dL 1.0  Alkaline Phos 38 - 126 U/L 53  AST 15 - 41 U/L 21  ALT 0 - 44 U/L 13   CBC Latest Ref Rng & Units 07/06/2018  WBC 3.6 - 11.0 K/uL 6.5  Hemoglobin 12.0 - 16.0 g/dL 13.1  Hematocrit 35.0 - 47.0 % 37.8  Platelets 150 - 440 K/uL 233    No images are attached to the encounter.  US Breast Ltd Uni Right Inc Axilla  Result Date: 06/15/2018 CLINICAL DATA:  Patient complains of a palpable abnormality in the 9 o'clock region of the right breast. EXAM: DIGITAL DIAGNOSTIC BILATERAL MAMMOGRAM WITH CAD AND TOMO ULTRASOUND RIGHT BREAST COMPARISON:  Previous exam(s). ACR Breast Density Category c: The breast tissue is heterogeneously dense, which may obscure small masses. FINDINGS: There is no suspicious mass in either breast. In the lateral aspect of the right breast there are coarse calcifications spanning an area of 1.8 cm. They have a dystrophic appearance but are in a ductal distribution. There are no malignant type microcalcifications in the left breast. Mammographic images were processed with CAD. On physical exam, I palpate a discrete area of thickening in the right breast at 9 o'clock 3 cm from the nipple. Targeted ultrasound is performed, showing an asymmetric hypoechoic mass with punctate echogenic foci (corresponding with calcifications seen  mammographically). The area measures 4.0 x 1.2 x 2.7 cm. Sonographic evaluation the right axilla does not show any enlarged adenopathy. IMPRESSION: Suspicious lesion in the 9 o'clock region of the right breast. RECOMMENDATION: Ultrasound-guided core biopsy of the right breast is recommended. I have discussed the findings and recommendations with the patient. Results were also provided in writing at the conclusion of the visit. If applicable, a reminder letter will be sent to the patient regarding the next appointment. BI-RADS CATEGORY  4: Suspicious. Electronically Signed   By: Lillia Mountain M.D.   On: 06/15/2018 15:09   Mm Diag Breast Tomo Bilateral  Result Date: 06/15/2018 CLINICAL DATA:  Patient complains of a palpable abnormality in the 9 o'clock region of the right breast. EXAM: DIGITAL DIAGNOSTIC BILATERAL MAMMOGRAM WITH CAD AND TOMO ULTRASOUND RIGHT BREAST COMPARISON:  Previous exam(s). ACR Breast Density Category c: The breast tissue is heterogeneously dense, which may obscure small masses. FINDINGS: There is no suspicious mass in either breast. In the lateral aspect of the right breast there are coarse calcifications spanning an area of 1.8 cm. They have a dystrophic appearance but are in a ductal distribution. There are no malignant type microcalcifications in the left breast. Mammographic images were processed with CAD. On physical exam, I palpate a discrete area of thickening in the right breast at 9 o'clock 3 cm from the nipple. Targeted ultrasound is performed, showing an asymmetric hypoechoic mass with punctate echogenic foci (corresponding with calcifications seen mammographically). The area measures 4.0 x 1.2 x 2.7 cm. Sonographic evaluation the right axilla does not show any enlarged adenopathy. IMPRESSION: Suspicious lesion in the 9 o'clock region of the right breast. RECOMMENDATION: Ultrasound-guided core biopsy of the right breast is recommended. I have discussed the findings and  recommendations with the patient. Results were also provided in writing at the conclusion of the visit. If applicable, a reminder letter will be sent to the patient regarding the next appointment. BI-RADS CATEGORY  4: Suspicious. Electronically Signed   By: Lillia Mountain M.D.   On: 06/15/2018 15:09   Mm Clip  Placement Right  Result Date: 06/23/2018 CLINICAL DATA:  Evaluate clip placement following ultrasound-guided RIGHT breast biopsy. EXAM: DIAGNOSTIC RIGHT MAMMOGRAM POST ULTRASOUND BIOPSY COMPARISON:  Previous exam(s). FINDINGS: Mammographic images were obtained following ultrasound guided biopsy of the 4 cm hypoechoic mass at the 9 o'clock position of the RIGHT breast. The RIBBON shaped clip is in satisfactory position. IMPRESSION: Satisfactory RIBBON clip position following ultrasound-guided RIGHT breast biopsy. Final Assessment: Post Procedure Mammograms for Marker Placement Electronically Signed   By: Margarette Canada M.D.   On: 06/23/2018 09:42   [place] Rt Breast Bx W Loc Dev 1st Lesion Img Bx Spec US Guide  Addendum Date: 06/24/2018   ADDENDUM REPORT: 06/24/2018 13:44 ADDENDUM: PATHOLOGY: BREAST, RIGHT, 9 O'CLOCK; ULTRASOUND-GUIDED CORE BIOPSY: - DUCTAL CARCINOMA IN SITU (DCIS), INTERMEDIATE NUCLEAR GRADE, ASSOCIATED WITH CALCIFICATION, SEE COMMENT. CONCORDANT:  Yes I discussed these results and the recommendations below with the patient by telephone on 06/24/2018 at 1:45 p.m. All of her questions were answered. She denies significant pain or bleeding at the biopsy site. Biopsy site care instructions were reviewed with the patient. RECOMMENDATION:  Surgical referral. Electronically Signed   By: Franki Cabot M.D.   On: 06/24/2018 13:44   Result Date: 06/24/2018 CLINICAL DATA:  57 year old female for tissue sampling of 4 cm OUTER RIGHT breast mass. EXAM: ULTRASOUND GUIDED RIGHT BREAST CORE NEEDLE BIOPSY COMPARISON:  Previous exam(s). FINDINGS: I met with the patient and we discussed the procedure of  ultrasound-guided biopsy, including benefits and alternatives. We discussed the high likelihood of a successful procedure. We discussed the risks of the procedure, including infection, bleeding, tissue injury, clip migration, and inadequate sampling. Informed written consent was given. The usual time-out protocol was performed immediately prior to the procedure. Using sterile technique and 1% Lidocaine as local anesthetic, under direct ultrasound visualization, a 12 gauge spring-loaded device was used to perform biopsy of the 4 cm mass at the 9 o'clock position of the RIGHT breast using a LATERAL approach. At the conclusion of the procedure a RIBBON shaped tissue marker clip was deployed into the biopsy cavity. Follow up 2 view mammogram was performed and dictated separately. IMPRESSION: Ultrasound guided biopsy of 4 cm OUTER RIGHT breast mass. No apparent complications. Electronically Signed: By: Margarette Canada M.D. On: 06/23/2018 09:44    Assessment and plan- Patient is a 57 y.o. 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  Ultrasound shows a 4 cm mass in her right breast and core biopsy reveals DCIS.  She is planned to undergo lumpectomy and sentinel lymph node biopsy by Dr. Rosana Hoes later this week.  I will see her back in about 10 days time after her surgery to discuss the results of final pathology and further management.  If there is no evidence of invasive breast cancer found at the time of surgery-there would be no indication for chemotherapy for DCIS.  She would be a candidate for adjuvant radiation therapy and I will try to coordinate her radiation oncology appointment the same day she sees me.  Given that her DCIS is ER positive there would be a role for hormone therapy and I would recommend Arimidex for 5 years given that she is postmenopausal.  I will discuss the risks and benefits of  Arimidex at her next visit.  We will attempt to get a bone density scan in the interim.  Treatment will be given  with a curative intent   Thank you for this kind referral and the opportunity to participate in the care of this patient   Visit Diagnosis 1. Cigarette nicotine dependence, uncomplicated   2. Ductal carcinoma in situ (DCIS) of right breast   3. Goals of care, counseling/discussion     Dr. Randa Evens, MD, MPH [facility] [phone] 07/07/2018  10:18 AM

## 2018-07-08 ENCOUNTER — Ambulatory Visit: Payer: BLUE CROSS/BLUE SHIELD | Admitting: Anesthesiology

## 2018-07-08 ENCOUNTER — Encounter
Admission: RE | Disposition: A | Discharge status: Home / Self Care | Payer: Self-pay | Source: Ambulatory Visit | Attending: Surgery

## 2018-07-08 ENCOUNTER — Ambulatory Visit
Admission: RE | Admit: 2018-07-08 | Discharge: 2018-07-08 | Disposition: A | Discharge status: Home / Self Care | Payer: BLUE CROSS/BLUE SHIELD | Source: Ambulatory Visit | Attending: Surgery | Admitting: Surgery

## 2018-07-08 ENCOUNTER — Encounter: Payer: Self-pay | Admitting: *Deleted

## 2018-07-08 DIAGNOSIS — K219 Gastro-esophageal reflux disease without esophagitis: Secondary | ICD-10-CM | POA: Diagnosis not present

## 2018-07-08 DIAGNOSIS — F17210 Nicotine dependence, cigarettes, uncomplicated: Secondary | ICD-10-CM | POA: Insufficient documentation

## 2018-07-08 DIAGNOSIS — D509 Iron deficiency anemia, unspecified: Secondary | ICD-10-CM | POA: Diagnosis not present

## 2018-07-08 DIAGNOSIS — F329 Major depressive disorder, single episode, unspecified: Secondary | ICD-10-CM | POA: Insufficient documentation

## 2018-07-08 DIAGNOSIS — D0511 Intraductal carcinoma in situ of right breast: Secondary | ICD-10-CM

## 2018-07-08 DIAGNOSIS — D0591 Unspecified type of carcinoma in situ of right breast: Secondary | ICD-10-CM | POA: Diagnosis present

## 2018-07-08 DIAGNOSIS — N6031 Fibrosclerosis of right breast: Secondary | ICD-10-CM | POA: Insufficient documentation

## 2018-07-08 DIAGNOSIS — Z79899 Other long term (current) drug therapy: Secondary | ICD-10-CM | POA: Diagnosis not present

## 2018-07-08 HISTORY — PX: BREAST LUMPECTOMY: SHX2

## 2018-07-08 SURGERY — BREAST LUMPECTOMY
Anesthesia: General | Laterality: Right | Wound class: Clean

## 2018-07-08 MED ORDER — ACETAMINOPHEN 500 MG PO TABS
1000.0000 mg | ORAL_TABLET | ORAL | Status: AC
  Administered 2018-07-08: 1000 mg via ORAL

## 2018-07-08 MED ORDER — ACETAMINOPHEN 500 MG PO TABS
ORAL_TABLET | ORAL | Status: AC
  Administered 2018-07-08: 1000 mg via ORAL
  Filled 2018-07-08: qty 2

## 2018-07-08 MED ORDER — MIDAZOLAM HCL 2 MG/2ML IJ SOLN
INTRAMUSCULAR | Status: AC
  Filled 2018-07-08: qty 2

## 2018-07-08 MED ORDER — LACTATED RINGERS IV SOLN
INTRAVENOUS | Status: DC
  Administered 2018-07-08: 10:00:00 via INTRAVENOUS

## 2018-07-08 MED ORDER — EPHEDRINE SULFATE 50 MG/ML IJ SOLN
INTRAMUSCULAR | Status: DC | PRN
  Administered 2018-07-08 (×3): 10 mg via INTRAVENOUS

## 2018-07-08 MED ORDER — PROPOFOL 10 MG/ML IV BOLUS
INTRAVENOUS | Status: AC
  Filled 2018-07-08: qty 20

## 2018-07-08 MED ORDER — CHLORHEXIDINE GLUCONATE CLOTH 2 % EX PADS
6.0000 | MEDICATED_PAD | Freq: Once | CUTANEOUS | Status: AC
  Administered 2018-07-08: 6 via TOPICAL

## 2018-07-08 MED ORDER — FENTANYL CITRATE (PF) 100 MCG/2ML IJ SOLN
INTRAMUSCULAR | Status: AC
  Filled 2018-07-08: qty 2

## 2018-07-08 MED ORDER — GABAPENTIN 300 MG PO CAPS
ORAL_CAPSULE | ORAL | Status: AC
Start: 2018-07-08 — End: 2018-07-08
  Administered 2018-07-08: 300 mg via ORAL
  Filled 2018-07-08: qty 1

## 2018-07-08 MED ORDER — PROMETHAZINE HCL 25 MG/ML IJ SOLN: 6.2500 mg | INTRAMUSCULAR | Status: DC | PRN

## 2018-07-08 MED ORDER — KETOROLAC TROMETHAMINE 30 MG/ML IJ SOLN
INTRAMUSCULAR | Status: AC
  Filled 2018-07-08: qty 1

## 2018-07-08 MED ORDER — MEPERIDINE HCL 50 MG/ML IJ SOLN: 6.2500 mg | INTRAMUSCULAR | Status: DC | PRN

## 2018-07-08 MED ORDER — BUPIVACAINE HCL (PF) 0.5 % IJ SOLN
INTRAMUSCULAR | Status: AC
  Filled 2018-07-08: qty 30

## 2018-07-08 MED ORDER — FENTANYL CITRATE (PF) 100 MCG/2ML IJ SOLN
INTRAMUSCULAR | Status: DC | PRN
  Administered 2018-07-08 (×2): 50 ug via INTRAVENOUS

## 2018-07-08 MED ORDER — FENTANYL CITRATE (PF) 100 MCG/2ML IJ SOLN: 25.0000 ug | INTRAMUSCULAR | Status: DC | PRN

## 2018-07-08 MED ORDER — LIDOCAINE HCL (PF) 2 % IJ SOLN
INTRAMUSCULAR | Status: AC
  Filled 2018-07-08: qty 10

## 2018-07-08 MED ORDER — KETOROLAC TROMETHAMINE 30 MG/ML IJ SOLN
INTRAMUSCULAR | Status: DC | PRN
  Administered 2018-07-08: 30 mg via INTRAVENOUS

## 2018-07-08 MED ORDER — ONDANSETRON HCL 4 MG/2ML IJ SOLN
INTRAMUSCULAR | Status: AC
Start: 2018-07-08 — End: ?
  Filled 2018-07-08: qty 2

## 2018-07-08 MED ORDER — ONDANSETRON HCL 4 MG/2ML IJ SOLN
INTRAMUSCULAR | Status: DC | PRN
  Administered 2018-07-08: 4 mg via INTRAVENOUS

## 2018-07-08 MED ORDER — OXYCODONE HCL 5 MG/5ML PO SOLN: 5.0000 mg | Freq: Once | ORAL | Status: DC | PRN

## 2018-07-08 MED ORDER — LIDOCAINE HCL (CARDIAC) PF 100 MG/5ML IV SOSY
PREFILLED_SYRINGE | INTRAVENOUS | Status: DC | PRN
  Administered 2018-07-08: 50 mg via INTRAVENOUS

## 2018-07-08 MED ORDER — EPHEDRINE SULFATE 50 MG/ML IJ SOLN
INTRAMUSCULAR | Status: AC
  Filled 2018-07-08: qty 1

## 2018-07-08 MED ORDER — DEXAMETHASONE SODIUM PHOSPHATE 10 MG/ML IJ SOLN
INTRAMUSCULAR | Status: DC | PRN
  Administered 2018-07-08: 5 mg via INTRAVENOUS

## 2018-07-08 MED ORDER — GABAPENTIN 300 MG PO CAPS
300.0000 mg | ORAL_CAPSULE | ORAL | Status: AC
  Administered 2018-07-08: 300 mg via ORAL

## 2018-07-08 MED ORDER — DEXAMETHASONE SODIUM PHOSPHATE 10 MG/ML IJ SOLN
INTRAMUSCULAR | Status: AC
  Filled 2018-07-08: qty 1

## 2018-07-08 MED ORDER — PROPOFOL 10 MG/ML IV BOLUS
INTRAVENOUS | Status: DC | PRN
  Administered 2018-07-08: 180 mg via INTRAVENOUS
  Administered 2018-07-08: 20 mg via INTRAVENOUS

## 2018-07-08 MED ORDER — GLYCOPYRROLATE 0.2 MG/ML IJ SOLN
INTRAMUSCULAR | Status: DC | PRN
  Administered 2018-07-08: 0.2 mg via INTRAVENOUS

## 2018-07-08 MED ORDER — OXYCODONE HCL 5 MG PO TABS: 5.0000 mg | ORAL_TABLET | Freq: Once | ORAL | Status: DC | PRN

## 2018-07-08 MED ORDER — CHLORHEXIDINE GLUCONATE CLOTH 2 % EX PADS: 6.0000 | MEDICATED_PAD | Freq: Once | CUTANEOUS | Status: DC

## 2018-07-08 MED ORDER — LIDOCAINE-EPINEPHRINE (PF) 1 %-1:200000 IJ SOLN
INTRAMUSCULAR | Status: DC | PRN
  Administered 2018-07-08: 10 mL via INTRAMUSCULAR
  Administered 2018-07-08: 6 mL via INTRAMUSCULAR

## 2018-07-08 MED ORDER — CEFAZOLIN SODIUM-DEXTROSE 2-4 GM/100ML-% IV SOLN
INTRAVENOUS | Status: AC
  Filled 2018-07-08: qty 100

## 2018-07-08 MED ORDER — MIDAZOLAM HCL 2 MG/2ML IJ SOLN
INTRAMUSCULAR | Status: DC | PRN
  Administered 2018-07-08: 2 mg via INTRAVENOUS

## 2018-07-08 MED ORDER — LIDOCAINE-EPINEPHRINE (PF) 1 %-1:200000 IJ SOLN
INTRAMUSCULAR | Status: AC
  Filled 2018-07-08: qty 30

## 2018-07-08 MED ORDER — OXYCODONE-ACETAMINOPHEN 5-325 MG PO TABS: 1.0000 | ORAL_TABLET | ORAL | 0 refills | Status: DC | PRN

## 2018-07-08 MED ORDER — GLYCOPYRROLATE 0.2 MG/ML IJ SOLN
INTRAMUSCULAR | Status: AC
  Filled 2018-07-08: qty 1

## 2018-07-08 SURGICAL SUPPLY — 32 items
BLADE SURG 15 STRL LF DISP TIS (BLADE) ×1 IMPLANT
BLADE SURG 15 STRL SS (BLADE) ×1
CANISTER SUCT 1200ML W/VALVE (MISCELLANEOUS) ×2 IMPLANT
CHLORAPREP W/TINT 26ML (MISCELLANEOUS) ×2 IMPLANT
CNTNR SPEC 2.5X3XGRAD LEK (MISCELLANEOUS) ×2
CONT SPEC 4OZ STER OR WHT (MISCELLANEOUS) ×2
CONTAINER SPEC 2.5X3XGRAD LEK (MISCELLANEOUS) ×2 IMPLANT
COVER PROBE FLX POLY STRL (MISCELLANEOUS) ×2 IMPLANT
DERMABOND ADVANCED (GAUZE/BANDAGES/DRESSINGS) ×1
DERMABOND ADVANCED .7 DNX12 (GAUZE/BANDAGES/DRESSINGS) ×1 IMPLANT
DRAPE LAPAROTOMY TRNSV 106X77 (MISCELLANEOUS) ×2 IMPLANT
ELECT CAUTERY BLADE 6.4 (BLADE) ×2 IMPLANT
ELECT REM PT RETURN 9FT ADLT (ELECTROSURGICAL) ×2
ELECTRODE REM PT RTRN 9FT ADLT (ELECTROSURGICAL) ×1 IMPLANT
GLOVE BIO SURGEON STRL SZ7 (GLOVE) ×6 IMPLANT
GLOVE INDICATOR 7.5 STRL GRN (GLOVE) ×6 IMPLANT
GOWN STRL REUS W/ TWL LRG LVL3 (GOWN DISPOSABLE) ×2 IMPLANT
GOWN STRL REUS W/ TWL XL LVL3 (GOWN DISPOSABLE) ×1 IMPLANT
GOWN STRL REUS W/TWL LRG LVL3 (GOWN DISPOSABLE) ×2
GOWN STRL REUS W/TWL XL LVL3 (GOWN DISPOSABLE) ×1
LABEL OR SOLS (LABEL) ×2 IMPLANT
MARGIN MAP 10MM (MISCELLANEOUS) ×2 IMPLANT
NEEDLE HYPO 25X1 1.5 SAFETY (NEEDLE) ×2 IMPLANT
PACK BASIN MINOR ARMC (MISCELLANEOUS) ×2 IMPLANT
SUT MNCRL 4-0 (SUTURE) ×1
SUT MNCRL 4-0 27XMFL (SUTURE) ×1
SUT SILK 2 0 SH (SUTURE) ×2 IMPLANT
SUT VIC AB 3-0 SH 27 (SUTURE) ×1
SUT VIC AB 3-0 SH 27X BRD (SUTURE) ×1 IMPLANT
SUTURE MNCRL 4-0 27XMF (SUTURE) ×1 IMPLANT
SYR 10ML LL (SYRINGE) ×2 IMPLANT
WATER STERILE IRR 1000ML POUR (IV SOLUTION) ×2 IMPLANT

## 2018-07-08 NOTE — Anesthesia Post-op Follow-up Note (Signed)
Anesthesia QCDR form completed.        

## 2018-07-08 NOTE — Op Note (Signed)
SURGICAL OPERATIVE REPORT  DATE OF PROCEDURE: 07/08/2018  ATTENDING Surgeon(s): [name], MD  ANESTHESIA: General  PRE-OPERATIVE DIAGNOSIS: Palpable Right breast mass with core needle biopsy-proven DCIS (icd-10: D05.11)  POST-OPERATIVE DIAGNOSIS: Palpable Right breast mass with core needle biopsy-proven DCIS (icd-10: D05.11)  PROCEDURE(S):  1.) Right breast lumpectomy/partial mastectomy  INTRAOPERATIVE FINDINGS: Palpable Right breast mass removed in its entirety  INTRAVENOUS FLUIDS: 600 mL crystalloid   ESTIMATED BLOOD LOSS: Minimal (<20 mL)  URINE OUTPUT: No Foley   SPECIMENS: Palpable Right breast mass (lumpectomy/partial mastectomy): long silk suture - lateral, short silk suture - superior with second additional residual breast mass (lateral aspect of same mass) marked same as described for first primary specimen   IMPLANTS: None  DRAINS: None  COMPLICATIONS: None apparent  CONDITION AT END OF PROCEDURE: Hemodynamically stable and extubated  DISPOSITION OF PATIENT: PACU  INDICATIONS FOR PROCEDURE:  57 y.o. femalepresented for evaluation of an easily palpable and enlarged Right breast mass with subsequent abnormal breast ultrasound with associated calcifications on mammography, after which core needle biopsy demonstrated DCIS, for which excisional biopsy was advised. All risks, benefits, and alternatives to lumpectomy vs mastectomy were discussed with the patient, all of patient's questions were answered to her expressed satisfaction, patient elected to proceed with lumpectomy, and informed consent was accordingly obtained and documented at that time.  DETAILS OF PROCEDURE: Patient was brought to the operating suite and appropriately identified. General anesthesia was administered along with appropriate pre-operative antibiotics, and endotracheal intubation was performed by anesthetist. In supine position, operative site was prepped and draped in the  usual sterile fashion, and following a brief time out, local anesthetic was injected over marked planned Right circumareolar incision site. Circumareolar incision was then made using a #15 blade scalpel, and appropriate thickness skin flaps were created. Excision was continued deep around the easily palpable rather large Right breast mass to chest wall in its entirety with appropriately wide margins. Long lateral suture and short superior suture were used to orient the specimen, which was then handed off the field for pathology processing. Reassessment of the patient's Right breast revealed some additional firm tissue lateral and in continuity with the excised mass, and this additional tissue was likewise excised and marked with silk sutures. Hemostasis was confirmed, and the wound was re-approximated in layers using buried interrupted 3-0 Vicryl for dermis, and sterile Dermabond skin glue was applied and allowed to dry.  Patient was then safely able to be extubated, awakened, and transferred to PACU for post-operative monitoring and care.  I was present for all aspects of the above procedure, and no operative complications were apparent.

## 2018-07-08 NOTE — Interval H&P Note (Signed)
History and Physical Interval Note:  07/08/2018 9:44 AM  Jennifer Rowe  has presented today for surgery, with the diagnosis of DCIS  The various methods of treatment have been discussed with the patient and family. After consideration of risks, benefits and other options for treatment, the patient has consented to  Procedure(s): BREAST LUMPECTOMY (Right) as a surgical intervention .  The patient's history has been reviewed, patient examined, no change in status, stable for surgery.  I have reviewed the patient's chart and labs.  Questions were answered to the patient's satisfaction.     [name]

## 2018-07-08 NOTE — Anesthesia Procedure Notes (Signed)
Procedure Name: LMA Insertion Date/Time: 07/08/2018 10:09 AM Performed by: Jonna Clark, CRNA Pre-anesthesia Checklist: Patient identified, Patient being monitored, Timeout performed, Emergency Drugs available and Suction available Patient Re-evaluated:Patient Re-evaluated prior to induction Oxygen Delivery Method: Circle system utilized Preoxygenation: Pre-oxygenation with 100% oxygen Induction Type: IV induction Ventilation: Mask ventilation without difficulty LMA: LMA inserted LMA Size: 4.0 Tube type: Oral Number of attempts: 1 Placement Confirmation: positive ETCO2 and breath sounds checked- equal and bilateral Tube secured with: Tape Dental Injury: Teeth and Oropharynx as per pre-operative assessment

## 2018-07-08 NOTE — Anesthesia Preprocedure Evaluation (Signed)
Anesthesia Evaluation  Patient identified by MRN, date of birth, ID band Patient awake    Reviewed: Allergy & Precautions, NPO status , Patient's Chart, lab work & pertinent test results  History of Anesthesia Complications Negative for: history of anesthetic complications  Airway Mallampati: II  TM Distance: >3 FB Neck ROM: Full    Dental  (+) Upper Dentures, Missing, Poor Dentition   Pulmonary neg sleep apnea, neg COPD, Current Smoker,    breath sounds clear to auscultation- rhonchi (-) wheezing      Cardiovascular Exercise Tolerance: Good (-) hypertension(-) CAD, (-) Past MI, (-) Cardiac Stents and (-) CABG  Rhythm:Regular Rate:Normal - Systolic murmurs and - Diastolic murmurs    Neuro/Psych PSYCHIATRIC DISORDERS Anxiety Depression negative neurological ROS     GI/Hepatic Neg liver ROS, GERD  ,  Endo/Other  negative endocrine ROSneg diabetes  Renal/GU negative Renal ROS     Musculoskeletal negative musculoskeletal ROS (+)   Abdominal (+) - obese,   Peds  Hematology  (+) anemia ,   Anesthesia Other Findings Past Medical History: No date: Anxiety No date: Depression 08/26/2010: Gastro-esophageal reflux disease without esophagitis No date: Iron deficiency anemia 08/14/2016: Vocal cord polyp   Reproductive/Obstetrics                             Anesthesia Physical Anesthesia Plan  ASA: II  Anesthesia Plan: General   Post-op Pain Management:    Induction: Intravenous  PONV Risk Score and Plan: 1 and Ondansetron and Midazolam  Airway Management Planned: LMA  Additional Equipment:   Intra-op Plan:   Post-operative Plan:   Informed Consent: I have reviewed the patients History and Physical, chart, labs and discussed the procedure including the risks, benefits and alternatives for the proposed anesthesia with the patient or authorized representative who has indicated his/her  understanding and acceptance.   Dental advisory given  Plan Discussed with: CRNA and Anesthesiologist  Anesthesia Plan Comments:         Anesthesia Quick Evaluation

## 2018-07-08 NOTE — Transfer of Care (Signed)
Immediate Anesthesia Transfer of Care Note  Patient: Jennifer Rowe  Procedure(s) Performed: BREAST LUMPECTOMY (Right )  Patient Location: PACU  Anesthesia Type:General  Level of Consciousness: drowsy and patient cooperative  Airway & Oxygen Therapy: Patient Spontanous Breathing and Patient connected to face mask oxygen  Post-op Assessment: Report given to RN and Post -op Vital signs reviewed and stable  Post vital signs: Reviewed and stable  Last Vitals:  Vitals Value Taken Time  BP 118/67 07/08/2018 12:29 PM  Temp 36.7 C 07/08/2018 12:29 PM  Pulse 60 07/08/2018 12:30 PM  Resp 13 07/08/2018 12:30 PM  SpO2 100 % 07/08/2018 12:30 PM  Vitals shown include unvalidated device data.  Last Pain:  Vitals:   07/08/18 0907  TempSrc: Oral         Complications: No apparent anesthesia complications

## 2018-07-08 NOTE — Discharge Instructions (Addendum)
In addition to included general post-operative instructions for Right Breast Lumpectomy,  Diet: Resume home heart healthy diet.   Activity: No heavy lifting >15 - 20 pounds (children, pets, laundry, garbage) or strenuous activity until follow-up, but light activity and walking are encouraged. Do not drive or drink alcohol if taking narcotic pain medications.  Wound care: 2 days after surgery (Sunday, 8/11), you may shower/get incision wet with soapy water and pat dry (do not rub incisions), but no baths or submerging incision underwater until follow-up.   Medications: Resume all home medications. For mild to moderate pain: acetaminophen (Tylenol) or ibuprofen/naproxen (if no kidney disease). Combining Tylenol with alcohol can substantially increase your risk of causing liver disease. Narcotic pain medications, if prescribed, can be used for severe pain, though may cause nausea, constipation, and drowsiness. Do not combine Tylenol and Percocet (or similar) within a 6 hour period as Percocet (and similar) contain(s) Tylenol. If you do not need the narcotic pain medication, you do not need to fill the prescription.  Call office [phone]) at any time if any questions, worsening pain, fevers/chills, bleeding, drainage from incision site, or other concerns.  AMBULATORY SURGERY  DISCHARGE INSTRUCTIONS   1) The drugs that you were given will stay in your system until tomorrow so for the next 24 hours you should not:  A) Drive an automobile B) Make any legal decisions C) Drink any alcoholic beverage   2) You may resume regular meals tomorrow.  Today it is better to start with liquids and gradually work up to solid foods.  You may eat anything you prefer, but it is better to start with liquids, then soup and crackers, and gradually work up to solid foods.   3) Please notify your doctor immediately if you have any unusual bleeding, trouble breathing, redness and pain at the surgery site,  drainage, fever, or pain not relieved by medication.    4) Additional Instructions:        Please contact your physician with any problems or Same Day Surgery at [phone], Monday through Friday 6 am to 4 pm, or [place] at [facility] number at [phone].

## 2018-07-10 ENCOUNTER — Encounter: Payer: Self-pay | Admitting: Surgery

## 2018-07-11 NOTE — Anesthesia Postprocedure Evaluation (Signed)
Anesthesia Post Note  Patient: Jennifer Rowe  Procedure(s) Performed: BREAST LUMPECTOMY (Right )  Patient location during evaluation: PACU Anesthesia Type: General Level of consciousness: awake and alert and oriented Pain management: pain level controlled Vital Signs Assessment: post-procedure vital signs reviewed and stable Respiratory status: spontaneous breathing Cardiovascular status: blood pressure returned to baseline Anesthetic complications: no     Last Vitals:  Vitals:   07/08/18 1340 07/08/18 1348  BP: 112/68 117/69  Pulse: (!) 58 60  Resp: 11 20  Temp:  (!) 36.3 C  SpO2: 98% 96%    Last Pain:  Vitals:   07/08/18 1348  TempSrc: Temporal  PainSc: 0-No pain                 Tacarra Justo

## 2018-07-12 LAB — SURGICAL PATHOLOGY

## 2018-07-13 ENCOUNTER — Ambulatory Visit: Payer: BLUE CROSS/BLUE SHIELD | Admitting: Radiation Oncology

## 2018-07-14 ENCOUNTER — Other Ambulatory Visit: Payer: Self-pay

## 2018-07-14 ENCOUNTER — Encounter: Payer: Self-pay | Admitting: Oncology

## 2018-07-14 ENCOUNTER — Ambulatory Visit
Admission: RE | Admit: 2018-07-14 | Discharge: 2018-07-14 | Disposition: A | Discharge status: Home / Self Care | Payer: BLUE CROSS/BLUE SHIELD | Source: Ambulatory Visit | Attending: Radiation Oncology | Admitting: Radiation Oncology

## 2018-07-14 ENCOUNTER — Inpatient Hospital Stay (HOSPITAL_BASED_OUTPATIENT_CLINIC_OR_DEPARTMENT_OTHER): Payer: BLUE CROSS/BLUE SHIELD | Admitting: Oncology

## 2018-07-14 ENCOUNTER — Encounter: Payer: Self-pay | Admitting: Radiation Oncology

## 2018-07-14 VITALS — BP 145/82 | HR 54 | Temp 97.7°F | Resp 18 | Ht 67.0 in | Wt 157.6 lb

## 2018-07-14 DIAGNOSIS — D0511 Intraductal carcinoma in situ of right breast: Secondary | ICD-10-CM

## 2018-07-14 DIAGNOSIS — F329 Major depressive disorder, single episode, unspecified: Secondary | ICD-10-CM

## 2018-07-14 DIAGNOSIS — Z170 Estrogen receptor positive status [ER+]: Secondary | ICD-10-CM

## 2018-07-14 DIAGNOSIS — F17210 Nicotine dependence, cigarettes, uncomplicated: Secondary | ICD-10-CM

## 2018-07-14 DIAGNOSIS — F419 Anxiety disorder, unspecified: Secondary | ICD-10-CM | POA: Diagnosis not present

## 2018-07-14 DIAGNOSIS — C50111 Malignant neoplasm of central portion of right female breast: Secondary | ICD-10-CM

## 2018-07-14 DIAGNOSIS — Z7189 Other specified counseling: Secondary | ICD-10-CM

## 2018-07-14 NOTE — Consult Note (Signed)
NEW PATIENT EVALUATION  Name: Jennifer Rowe  MRN: [id number]  Date:   07/14/2018     DOB: 07/24/1961   This 57 y.o. 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  REFERRING PHYSICIAN: Donnie Coffin, MD  CHIEF COMPLAINT:  Chief Complaint  Patient presents with  . Breast Cancer    Pt is here for initial consultation of breast cancer.      DIAGNOSIS: There were no encounter diagnoses.   PREVIOUS INVESTIGATIONS:  Mammogram and ultrasound reviewed Pathology reports reviewed Clinical notes reviewed  HPI: patient is a 57 year old female who presented with self discovered mass in her right breast.mammogram and ultrasound demonstrated an area of coarse calcifications spanning an area of 1.8 cm with a ductal distribution. She did have palpable discrete area thickening in the right breast at 9:00 position 3 cm from the nipple.She underwent ultrasound-guided biopsy showing ductal carcinoma in situ ER/PR positive PR borderline.she then underwent a wide local excision showing ductal carcinoma in situ intermediate to high grade with focal necrosis and calcifications.tumor measured 3.0 cm margins were clear at 0.7 mm. Patient is only 1 week out from surgery is doing well still has some slight oozing from the incision site. No evidence of mastitis. She is referred today for radiation oncology opinion.  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    PAST SURGICAL HISTORY:  Past Surgical History:  Procedure Laterality Date  . BREAST LUMPECTOMY Right 07/08/2018   Procedure: BREAST LUMPECTOMY;  Surgeon: [name], MD;  Location: [facility] ORS;  Service: General;   Laterality: Right;  . COLONOSCOPY  2017  . UPPER GI ENDOSCOPY  2009    FAMILY HISTORY: family history is not on file.  SOCIAL HISTORY:  reports that she has been smoking cigarettes. She has never used smokeless tobacco. She reports that she drinks alcohol. She reports that she has current or past drug history. Drug: Marijuana.  ALLERGIES: Patient has no known allergies.  MEDICATIONS:  Current Outpatient Medications  Medication Sig Dispense Refill  . diphenhydrAMINE (BENADRYL) 25 MG tablet Take 25 mg by mouth daily as needed for allergies.    Marland Kitchen esomeprazole (NEXIUM) 20 MG capsule Take 20 mg by mouth daily as needed (acid reflux).    . ferrous sulfate 325 (65 FE) MG EC tablet Take 325 mg by mouth twice weekly    . naproxen sodium (ALEVE) 220 MG tablet Take 220 mg by mouth daily as needed (pain).    Marland Kitchen oxyCODONE-acetaminophen (PERCOCET/ROXICET) 5-325 MG tablet Take 1 tablet by mouth every 4 (four) hours as needed for severe pain. (Patient not taking: Reported on 07/14/2018) 30 tablet 0  . venlafaxine XR (EFFEXOR-XR) 37.5 MG 24 hr capsule Take 37.5 mg by mouth daily.     No current facility-administered medications for this encounter.     ECOG PERFORMANCE STATUS:  0 - Asymptomatic  REVIEW OF SYSTEMS:  Patient denies any weight loss, fatigue, weakness, fever, chills or night sweats. Patient denies any loss of vision, blurred vision. Patient denies any ringing  of the ears or hearing loss. No irregular heartbeat. Patient denies heart murmur or history of fainting. Patient denies any chest pain or pain radiating to her upper extremities. Patient  denies any shortness of breath, difficulty breathing at night, cough or hemoptysis. Patient denies any swelling in the lower legs. Patient denies any nausea vomiting, vomiting of blood, or coffee ground material in the vomitus. Patient denies any stomach pain. Patient states has had normal bowel movements no significant constipation or diarrhea. Patient  denies any dysuria, hematuria or significant nocturia. Patient denies any problems walking, swelling in the joints or loss of balance. Patient denies any skin changes, loss of hair or loss of weight. Patient denies any excessive worrying or anxiety or significant depression. Patient denies any problems with insomnia. Patient denies excessive thirst, polyuria, polydipsia. Patient denies any swollen glands, patient denies easy bruising or easy bleeding. Patient denies any recent infections, allergies or URI. Patient "s visual fields have not changed significantly in recent time.    PHYSICAL EXAM: BP (P) 128/75 (BP Location: Left Arm, Patient Position: Sitting)   Pulse (P) 65   Temp (P) 97.8 F (36.6 C) (Tympanic)   Resp (P) 18   Wt (P) 157 lb 8.3 oz (71.5 kg)   BMI (P) 24.67 kg/m  Right breast has a circumlinear incision around the nipple areolar complex which is healing well still some slight oozing present. No other dominant mass or nodularity is noted in either breast in 2 positions examined. Breasts are large and pendulous. No axillary or supraclavicular adenopathy is identified.Well-developed well-nourished patient in NAD. HEENT reveals PERLA, EOMI, discs not visualized.  Oral cavity is clear. No oral mucosal lesions are identified. Neck is clear without evidence of cervical or supraclavicular adenopathy. Lungs are clear to A&P. Cardiac examination is essentially unremarkable with regular rate and rhythm without murmur rub or thrill. Abdomen is benign with no organomegaly or masses noted. Motor sensory and DTR levels are equal and symmetric in the upper and lower extremities. Cranial nerves II through XII are grossly intact. Proprioception is intact. No peripheral adenopathy or edema is identified. No motor or sensory levels are noted. Crude visual fields are within normal range.  LABORATORY DATA: pathology reports reviewed    RADIOLOGY RESULTS:mammogram and ultrasound reviewed   IMPRESSION:  ER positive ductal carcinoma in situ of the right breast as was wide local excision in 57 year old female  PLAN: this time I recommended whole breast radiation. I believe the breasts are too large and pendulous for hypofractionated course of treatment. Would plan on delivering 5040 cGy in 28 fractions. I will also boost her scar another 1400 cGy using electron beam. Risks and benefits of treatment including skin reaction fatigue alteration of blood counts possible inclusion of superficial lung all were discussed in detail with the patient. She seems to comprehend my treatment plan well. I personally ordered and scheduled CT simulation in about 1-1/2 weeks to allow further healing of her breast. Patient also will be candidate for antiestrogen therapy after completion of radiation.  I would like to take this opportunity to thank you for allowing me to participate in the care of your patient.Noreene Filbert, MD

## 2018-07-14 NOTE — Progress Notes (Signed)
No new changes noted today 

## 2018-07-15 ENCOUNTER — Encounter: Payer: Self-pay | Admitting: *Deleted

## 2018-07-15 ENCOUNTER — Telehealth: Payer: Self-pay

## 2018-07-15 NOTE — Progress Notes (Signed)
Hematology/Oncology Consult note [facility]  [phone] [phone]  Patient Care Team: Donnie Coffin, MD as PCP - General (Family Medicine)   Name of the patient: Jennifer Rowe  [id number]  06-21-1961   Date of visit: 07/15/18  Diagnosis- right breast ER positive DCIS s/p lumpectomy  Chief complaint/ Reason for visit- discuss pathology results  Heme/Onc history: -patient is a 57 year old female who self palpated a mass in her right breast which prompted a mammogram.Mammogram from 06/15/2018 showed an area of coarse calcifications spanning an area of 1.8 cm.  Ultrasound showed the area measured about 4 x 1.2 x 2.7 cm.  Sonographic evaluation of the right axilla does not reveal any enlarged adenopathy.  No suspicious findings in the left breast.  Core biopsy showed DCIS intermediate nuclear grade associated with calcification.  Features suggestive of a ruptured duct or cyst.  ER greater than 90% positive PR negative less than 1%.  Patient underwent lumpectomy on 07/08/2018 which showed intermediate to high nuclear grade DCIS with focal necrosis and calcification.  Large background lesion of stromal fibrosis.  No ink is seen on DCIS at the margins. There is DCIS less than 1 mm from  inferior margin in block A8. DCIS is between 1 and 2 mm from the margin  in A1 (inferior/anterior), A3 (inferior/medial), and A5 lateral (note  that additional lateral tissue submitted in B). This represents minimal  margin involvement (CAP terminology and NCCN guideline). However, given  the unique histologic features of this lesion, the significance of DCIS  <2 mm from the margin may not be as clinically significant as if the  DCIS were not arising in a well-defined fibrous lesion.   Patient has also been seen by radiation oncology and will be starting adjuvant radiation treatment soon  Interval history-she reports feeling sore at the site of lumpectomy but is  otherwise doing well.  Denies other complaints  ECOG PS- 0 Pain scale- 0   Review of systems- Review of Systems  Constitutional: Negative for chills, fever, malaise/fatigue and weight loss.  HENT: Negative for congestion, ear discharge and nosebleeds.   Eyes: Negative for blurred vision.  Respiratory: Negative for cough, hemoptysis, sputum production, shortness of breath and wheezing.   Cardiovascular: Negative for chest pain, palpitations, orthopnea and claudication.  Gastrointestinal: Negative for abdominal pain, blood in stool, constipation, diarrhea, heartburn, melena, nausea and vomiting.  Genitourinary: Negative for dysuria, flank pain, frequency, hematuria and urgency.  Musculoskeletal: Negative for back pain, joint pain and myalgias.  Skin: Negative for rash.  Neurological: Negative for dizziness, tingling, focal weakness, seizures, weakness and headaches.  Endo/Heme/Allergies: Does not bruise/bleed easily.  Psychiatric/Behavioral: Negative for depression and suicidal ideas. The patient does not have insomnia.        No Known Allergies   Past Medical History:  Diagnosis Date  . Anxiety   . Depression   . Gastro-esophageal reflux disease without esophagitis 08/26/2010  . Iron deficiency anemia   . 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 BREAST LUMPECTOMY Right 07/08/2018   Procedure: BREAST LUMPECTOMY;  Surgeon: [name], MD;  Location: [facility] ORS;  Service: General;  Laterality: Right;  . COLONOSCOPY  2017  . UPPER GI ENDOSCOPY  2009    Social History   Socioeconomic History  . Marital status: Single    Spouse name: Not on file  . Number of children: Not on file  . Years  of education: Not on file  . Highest education level: Not on file  Occupational History  . Not on file  Social Needs  . Financial resource strain: Not on file  . Food insecurity:    Worry: Not on file    Inability: Not on file  .  Transportation needs:    Medical: Not on file    Non-medical: Not on file  Tobacco Use  . Smoking status: Current Every Day Smoker    Types: Cigarettes  . Smokeless tobacco: Never Used  . Tobacco comment: 1 pack per week  Substance and Sexual Activity  . Alcohol use: Yes    Comment: rarely-social  . Drug use: Yes    Types: Marijuana  . Sexual activity: Not Currently  Lifestyle  . Physical activity:    Days per week: Not on file    Minutes per session: Not on file  . Stress: Not on file  Relationships  . Social connections:    Talks on phone: Not on file    Gets together: Not on file    Attends religious service: Not on file    Active member of club or organization: Not on file    Attends meetings of clubs or organizations: Not on file    Relationship status: Not on file  . Intimate partner violence:    Fear of current or ex partner: Not on file    Emotionally abused: Not on file    Physically abused: Not on file    Forced sexual activity: Not on file  Other Topics Concern  . Not on file  Social History Narrative  . Not on file    Family History  Problem Relation Age of Onset  . Breast cancer Neg Hx   . Cancer Neg Hx      Current Outpatient Medications:  .  ferrous sulfate 325 (65 FE) MG EC tablet, Take 325 mg by mouth twice weekly, Disp: , Rfl:  .  venlafaxine XR (EFFEXOR-XR) 37.5 MG 24 hr capsule, Take 37.5 mg by mouth daily., Disp: , Rfl:  .  diphenhydrAMINE (BENADRYL) 25 MG tablet, Take 25 mg by mouth daily as needed for allergies., Disp: , Rfl:  .  esomeprazole (NEXIUM) 20 MG capsule, Take 20 mg by mouth daily as needed (acid reflux)., Disp: , Rfl:  .  naproxen sodium (ALEVE) 220 MG tablet, Take 220 mg by mouth daily as needed (pain)., Disp: , Rfl:  .  oxyCODONE-acetaminophen (PERCOCET/ROXICET) 5-325 MG tablet, Take 1 tablet by mouth every 4 (four) hours as needed for severe pain. (Patient not taking: Reported on 07/14/2018), Disp: 30 tablet, Rfl: 0  Physical  exam:  Vitals:   07/14/18 1049  BP: (!) 145/82  Pulse: (!) 54  Resp: 18  Temp: 97.7 F (36.5 C)  TempSrc: Tympanic  SpO2: 97%  Weight: 157 lb 9.6 oz (71.5 kg)  Height: _0  (1.702 m)   Physical Exam  Constitutional: She is oriented to person, place, and time. She appears well-developed and well-nourished.  HENT:  Head: Normocephalic and atraumatic.  Eyes: Pupils are equal, round, and reactive to light. EOM are normal.  Neck: Normal range of motion.  Cardiovascular: Normal rate, regular rhythm and normal heart sounds.  Pulmonary/Chest: Effort normal and breath sounds normal.  Abdominal: Soft. Bowel sounds are normal.  Neurological: She is alert and oriented to person, place, and time.  Skin: Skin is warm and dry.  She is status post right lumpectomy.  Lumpectomy scar is well  opposed.  No sign of infection  CMP Latest Ref Rng & Units 07/06/2018  Glucose 70 - 99 mg/dL 111(H)  BUN 6 - 20 mg/dL 8  Creatinine 0.44 - 1.00 mg/dL 0.79  Sodium 135 - 145 mmol/L 142  Potassium 3.5 - 5.1 mmol/L 3.4(L)  Chloride 98 - 111 mmol/L 106  CO2 22 - 32 mmol/L 27  Calcium 8.9 - 10.3 mg/dL 9.1  Total Protein 6.5 - 8.1 g/dL 7.4  Total Bilirubin 0.3 - 1.2 mg/dL 1.0  Alkaline Phos 38 - 126 U/L 53  AST 15 - 41 U/L 21  ALT 0 - 44 U/L 13   CBC Latest Ref Rng & Units 07/06/2018  WBC 3.6 - 11.0 K/uL 6.5  Hemoglobin 12.0 - 16.0 g/dL 13.1  Hematocrit 35.0 - 47.0 % 37.8  Platelets 150 - 440 K/uL 233    No images are attached to the encounter.  US Breast Ltd Uni Right Inc Axilla  Result Date: 06/15/2018 CLINICAL DATA:  Patient complains of a palpable abnormality in the 9 o'clock region of the right breast. EXAM: DIGITAL DIAGNOSTIC BILATERAL MAMMOGRAM WITH CAD AND TOMO ULTRASOUND RIGHT BREAST COMPARISON:  Previous exam(s). ACR Breast Density Category c: The breast tissue is heterogeneously dense, which may obscure small masses. FINDINGS: There is no suspicious mass in either breast. In the lateral aspect  of the right breast there are coarse calcifications spanning an area of 1.8 cm. They have a dystrophic appearance but are in a ductal distribution. There are no malignant type microcalcifications in the left breast. Mammographic images were processed with CAD. On physical exam, I palpate a discrete area of thickening in the right breast at 9 o'clock 3 cm from the nipple. Targeted ultrasound is performed, showing an asymmetric hypoechoic mass with punctate echogenic foci (corresponding with calcifications seen mammographically). The area measures 4.0 x 1.2 x 2.7 cm. Sonographic evaluation the right axilla does not show any enlarged adenopathy. IMPRESSION: Suspicious lesion in the 9 o'clock region of the right breast. RECOMMENDATION: Ultrasound-guided core biopsy of the right breast is recommended. I have discussed the findings and recommendations with the patient. Results were also provided in writing at the conclusion of the visit. If applicable, a reminder letter will be sent to the patient regarding the next appointment. BI-RADS CATEGORY  4: Suspicious. Electronically Signed   By: Lillia Mountain M.D.   On: 06/15/2018 15:09   Mm Diag Breast Tomo Bilateral  Result Date: 06/15/2018 CLINICAL DATA:  Patient complains of a palpable abnormality in the 9 o'clock region of the right breast. EXAM: DIGITAL DIAGNOSTIC BILATERAL MAMMOGRAM WITH CAD AND TOMO ULTRASOUND RIGHT BREAST COMPARISON:  Previous exam(s). ACR Breast Density Category c: The breast tissue is heterogeneously dense, which may obscure small masses. FINDINGS: There is no suspicious mass in either breast. In the lateral aspect of the right breast there are coarse calcifications spanning an area of 1.8 cm. They have a dystrophic appearance but are in a ductal distribution. There are no malignant type microcalcifications in the left breast. Mammographic images were processed with CAD. On physical exam, I palpate a discrete area of thickening in the right breast  at 9 o'clock 3 cm from the nipple. Targeted ultrasound is performed, showing an asymmetric hypoechoic mass with punctate echogenic foci (corresponding with calcifications seen mammographically). The area measures 4.0 x 1.2 x 2.7 cm. Sonographic evaluation the right axilla does not show any enlarged adenopathy. IMPRESSION: Suspicious lesion in the 9 o'clock region of the right breast. RECOMMENDATION: Ultrasound-guided  core biopsy of the right breast is recommended. I have discussed the findings and recommendations with the patient. Results were also provided in writing at the conclusion of the visit. If applicable, a reminder letter will be sent to the patient regarding the next appointment. BI-RADS CATEGORY  4: Suspicious. Electronically Signed   By: Lillia Mountain M.D.   On: 06/15/2018 15:09   Mm Clip Placement Right  Result Date: 06/23/2018 CLINICAL DATA:  Evaluate clip placement following ultrasound-guided RIGHT breast biopsy. EXAM: DIAGNOSTIC RIGHT MAMMOGRAM POST ULTRASOUND BIOPSY COMPARISON:  Previous exam(s). FINDINGS: Mammographic images were obtained following ultrasound guided biopsy of the 4 cm hypoechoic mass at the 9 o'clock position of the RIGHT breast. The RIBBON shaped clip is in satisfactory position. IMPRESSION: Satisfactory RIBBON clip position following ultrasound-guided RIGHT breast biopsy. Final Assessment: Post Procedure Mammograms for Marker Placement Electronically Signed   By: Margarette Canada M.D.   On: 06/23/2018 09:42   [place] Rt Breast Bx W Loc Dev 1st Lesion Img Bx Spec US Guide  Addendum Date: 06/24/2018   ADDENDUM REPORT: 06/24/2018 13:44 ADDENDUM: PATHOLOGY: BREAST, RIGHT, 9 O'CLOCK; ULTRASOUND-GUIDED CORE BIOPSY: - DUCTAL CARCINOMA IN SITU (DCIS), INTERMEDIATE NUCLEAR GRADE, ASSOCIATED WITH CALCIFICATION, SEE COMMENT. CONCORDANT:  Yes I discussed these results and the recommendations below with the patient by telephone on 06/24/2018 at 1:45 p.m. All of her questions were answered. She  denies significant pain or bleeding at the biopsy site. Biopsy site care instructions were reviewed with the patient. RECOMMENDATION:  Surgical referral. Electronically Signed   By: Franki Cabot M.D.   On: 06/24/2018 13:44   Result Date: 06/24/2018 CLINICAL DATA:  56 year old female for tissue sampling of 4 cm OUTER RIGHT breast mass. EXAM: ULTRASOUND GUIDED RIGHT BREAST CORE NEEDLE BIOPSY COMPARISON:  Previous exam(s). FINDINGS: I met with the patient and we discussed the procedure of ultrasound-guided biopsy, including benefits and alternatives. We discussed the high likelihood of a successful procedure. We discussed the risks of the procedure, including infection, bleeding, tissue injury, clip migration, and inadequate sampling. Informed written consent was given. The usual time-out protocol was performed immediately prior to the procedure. Using sterile technique and 1% Lidocaine as local anesthetic, under direct ultrasound visualization, a 12 gauge spring-loaded device was used to perform biopsy of the 4 cm mass at the 9 o'clock position of the RIGHT breast using a LATERAL approach. At the conclusion of the procedure a RIBBON shaped tissue marker clip was deployed into the biopsy cavity. Follow up 2 view mammogram was performed and dictated separately. IMPRESSION: Ultrasound guided biopsy of 4 cm OUTER RIGHT breast mass. No apparent complications. Electronically Signed: By: Margarette Canada M.D. On: 06/23/2018 09:44     Assessment and plan- Patient is a 57 y.o. female with right breast DCIS status post lumpectomy  I did discuss with pathology about her close margins for DCIS since they were less than 2 mm in size.  However given the fact that this was arising in the background of a well-defined fibrous lesion, these margins may not be clinically significant and may not require reexcision.  Patient has seen radiation oncology and may be they can offer a radiation boost to these close margins at the time of  radiation treatment.  Given that her tumor was ER positive there would be a role for 5 years of hormone treatment.  Given that she is postmenopausal I would recommend 5 years of Arimidex upon completion of radiation treatment.  Discussed risks and benefits of Arimidex including  all but not limited to mood swings, hot flashes, arthralgias and worsening bone health and cardiovascular risk factors such as hypercholesterolemia.  Written information about Arimidex has been given to the patient.  Treatment will be given with a curative intent.  Patient understands and agrees to proceed as planned.  She will also take calcium 1200 mg along with vitamin D 800 international units.  We will obtain a baseline bone density scan at this time.  We will prescribe her Arimidex upon completion of radiation treatment and I will see her 6 weeks after that with CMP.  Cancer Staging Ductal carcinoma in situ (DCIS) of right breast Staging form: Breast, AJCC 8th Edition - Clinical stage from 07/14/2018: cTis (DCIS), ER+ - Signed by Sindy Guadeloupe, MD on 07/15/2018    Visit Diagnosis 1. Ductal carcinoma in situ (DCIS) of right breast   2. Goals of care, counseling/discussion      Dr. Randa Evens, MD, MPH [facility] [phone] 07/15/2018 11:09 AM

## 2018-07-15 NOTE — Progress Notes (Signed)
FMLA papers completed and faxed to [facility] [employer]. Return to work date 07-25-18.

## 2018-07-15 NOTE — Telephone Encounter (Signed)
FMLA paper work faxed to [employer] at this time.  Patient notified. Return to work date 07/25/18

## 2018-07-21 ENCOUNTER — Ambulatory Visit (INDEPENDENT_AMBULATORY_CARE_PROVIDER_SITE_OTHER): Payer: BLUE CROSS/BLUE SHIELD | Admitting: Surgery

## 2018-07-21 ENCOUNTER — Encounter: Payer: Self-pay | Admitting: Surgery

## 2018-07-21 VITALS — BP 125/73 | HR 71 | Temp 96.4°F | Ht 67.0 in | Wt 152.8 lb

## 2018-07-21 DIAGNOSIS — Z4889 Encounter for other specified surgical aftercare: Secondary | ICD-10-CM

## 2018-07-21 DIAGNOSIS — D0511 Intraductal carcinoma in situ of right breast: Secondary | ICD-10-CM

## 2018-07-21 NOTE — Patient Instructions (Addendum)
Please be sure to follow up with Dr.Crystal for radiation therapy.  You can take  Liquid or pill form Benadryl for the itching.    Please call the office if you have questions or concerns.   We will call you January 2020 to schedule your mammogram and follow up with Dr.Davis.

## 2018-07-21 NOTE — Progress Notes (Signed)
Surgical Clinic Progress/Follow-up Note   HPI:  57 y.o. 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 Patient denies pain, reports only itching around her post-surgical Right breast wound and a small amount dark bloody drainage from her incisional wound several days ago. She has been tolerating regular diet with +flatus and normal BM's, denies N/V, fever/chills, CP, or SOB.  Review of Systems:  Constitutional: denies fever/chills  Respiratory: denies shortness of breath, wheezing  Cardiovascular: denies chest pain, palpitations  Breasts: post-surgical pain and drainage as per HPI Gastrointestinal: denies abdominal pain, N/V, or diarrhea Skin: Denies any other rashes or skin discolorations except post-surgical wounds as per interval history  Vital Signs:  BP 125/73   Pulse 71   Temp (!) 96.4 F (35.8 C) (Temporal)   Ht 5\' 7"  (1.702 m)   Wt 152 lb 12.8 oz (69.3 kg)   BMI 23.93 kg/m    Physical Exam:  Constitutional:  -- Normal body habitus  -- Awake, alert, and oriented x3  Pulmonary:  -- No crackles -- Equal breath sounds bilaterally -- Breathing non-labored at rest Cardiovascular:  -- S1, S2 present  -- No pericardial rubs  Breasts: -- B/L large pendulous breasts -- Right breast completely NT with lateral circumareolar incision well-approximated without surrounding erythema and no incisional drainage able to be expressed despite underlying firmness consistent with likely seroma vs hematoma Gastrointestinal:  -- Soft and non-distended, non-tender to palpation, no guarding/rebound tenderness -- No abdominal masses appreciated, pulsatile or otherwise  Musculoskeletal / Integumentary:  -- Wounds or skin discoloration: None appreciated except post-surgical incisions as described above (Breast) -- Extremities: B/L UE and LE FROM, hands and feet warm, no edema    Pathology (07/08/2018) DCIS is within a large moderately circumscribed fibrous lesion. Most of  the epithelial structures in the lesion are NOT involved by DCIS.  However, DCIS is present in sections spanning the mass from anterior to  posterior and from medial to lateral, so the extent of DCIS is likely  about 3 cm. No invasive carcinoma is identified.   No ink is seen on DCIS at the margins. There is DCIS less than 1 mm from  inferior margin in block A8. DCIS is between 1 and 2 mm from the margin  in A1 (inferior/anterior), A3 (inferior/medial), and A5 lateral (note  that additional lateral tissue submitted in B). This represents minimal  margin involvement (CAP terminology and NCCN guideline). However, given  the unique histologic features of this lesion, the significance of DCIS  <2 mm from the margin may not be as clinically significant as if the  DCIS were not arising in a well-defined fibrous lesion.  Assessment:  57 y.o. yo Female with a problem list including...  Patient Active Problem List   Diagnosis Date Noted  . 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 Goals of care, counseling/discussion 07/07/2018  . Halitosis 08/14/2016  . 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 Cigarette nicotine dependence, uncomplicated 77/41/2878  . Gastro-esophageal reflux disease without esophagitis 08/26/2010    presents to clinic for post-op follow-up evaluation, doing overall well 13 Days s/p large Right breast lumpectomy for 3 cm DCIS with extensive surrounding fibrosis and asymptomatic likely peri-excisional seroma vs hematoma, not surprising considering size of excised tissue Jennifer Rowe, 07/08/2018).  Plan:              - pathology results discussed with patient  -  diphenhydramine prn for itching, advised not to scratch surgical incision  - notes from Dr. Janese Banks and Dr. Baruch Gouty reviewed, plan is for radiation without additional excision of margins despite inferior margin <2 mm  considering DCIS arising from well-defined fibrous lesion             - gradually resume all activities without restrictions after next 2 weeks with meanwhile no heavy lifting >40 lbs  - okay to submerge incisions under water (baths, swimming) prn after one more week considering reported recent drainage             - apply sunblock particularly to incisions with sun exposure to reduce pigmentation of scars             - return to clinic following mammogram/ultrasound 6 months after completion of treatment (our office will schedule)  - instructed to call office if any questions or concerns  All of the above recommendations were discussed with the patient, and all of patient's questions were answered to her expressed satisfaction.  -- Marilynne Drivers Jennifer Hoes, MD, [place]: [facility] - Partnering for exceptional care. Office: [phone]

## 2018-07-22 ENCOUNTER — Encounter: Payer: Self-pay | Admitting: Surgery

## 2018-07-25 ENCOUNTER — Ambulatory Visit
Admission: RE | Admit: 2018-07-25 | Discharge: 2018-07-25 | Disposition: A | Discharge status: Home / Self Care | Payer: BLUE CROSS/BLUE SHIELD | Source: Ambulatory Visit | Attending: Radiation Oncology | Admitting: Radiation Oncology

## 2018-07-25 DIAGNOSIS — Z170 Estrogen receptor positive status [ER+]: Secondary | ICD-10-CM | POA: Insufficient documentation

## 2018-07-25 DIAGNOSIS — Z510 Encounter for antineoplastic radiation therapy: Secondary | ICD-10-CM | POA: Insufficient documentation

## 2018-07-25 DIAGNOSIS — C50111 Malignant neoplasm of central portion of right female breast: Secondary | ICD-10-CM | POA: Diagnosis not present

## 2018-07-26 DIAGNOSIS — C50111 Malignant neoplasm of central portion of right female breast: Secondary | ICD-10-CM | POA: Diagnosis not present

## 2018-07-28 ENCOUNTER — Other Ambulatory Visit: Payer: Self-pay | Admitting: *Deleted

## 2018-07-28 DIAGNOSIS — Z170 Estrogen receptor positive status [ER+]: Principal | ICD-10-CM

## 2018-07-28 DIAGNOSIS — C50111 Malignant neoplasm of central portion of right female breast: Secondary | ICD-10-CM

## 2018-08-02 ENCOUNTER — Other Ambulatory Visit: Payer: BLUE CROSS/BLUE SHIELD

## 2018-08-02 ENCOUNTER — Ambulatory Visit
Admission: RE | Admit: 2018-08-02 | Discharge: 2018-08-02 | Disposition: A | Discharge status: Home / Self Care | Payer: BLUE CROSS/BLUE SHIELD | Source: Ambulatory Visit | Attending: Radiation Oncology | Admitting: Radiation Oncology

## 2018-08-02 DIAGNOSIS — Z510 Encounter for antineoplastic radiation therapy: Secondary | ICD-10-CM | POA: Diagnosis not present

## 2018-08-02 DIAGNOSIS — C50111 Malignant neoplasm of central portion of right female breast: Secondary | ICD-10-CM | POA: Diagnosis not present

## 2018-08-02 DIAGNOSIS — Z170 Estrogen receptor positive status [ER+]: Secondary | ICD-10-CM | POA: Diagnosis not present

## 2018-08-03 ENCOUNTER — Ambulatory Visit
Admission: RE | Admit: 2018-08-03 | Discharge: 2018-08-03 | Disposition: A | Discharge status: Home / Self Care | Payer: BLUE CROSS/BLUE SHIELD | Source: Ambulatory Visit | Attending: Radiation Oncology | Admitting: Radiation Oncology

## 2018-08-03 DIAGNOSIS — C50111 Malignant neoplasm of central portion of right female breast: Secondary | ICD-10-CM | POA: Diagnosis not present

## 2018-08-04 ENCOUNTER — Ambulatory Visit: Payer: BLUE CROSS/BLUE SHIELD

## 2018-08-04 ENCOUNTER — Ambulatory Visit
Admission: RE | Admit: 2018-08-04 | Discharge: 2018-08-04 | Disposition: A | Discharge status: Home / Self Care | Payer: BLUE CROSS/BLUE SHIELD | Source: Ambulatory Visit | Attending: Radiation Oncology | Admitting: Radiation Oncology

## 2018-08-04 DIAGNOSIS — C50111 Malignant neoplasm of central portion of right female breast: Secondary | ICD-10-CM | POA: Diagnosis not present

## 2018-08-05 ENCOUNTER — Ambulatory Visit
Admission: RE | Admit: 2018-08-05 | Discharge: 2018-08-05 | Disposition: A | Discharge status: Home / Self Care | Payer: BLUE CROSS/BLUE SHIELD | Source: Ambulatory Visit | Attending: Radiation Oncology | Admitting: Radiation Oncology

## 2018-08-05 ENCOUNTER — Ambulatory Visit: Payer: BLUE CROSS/BLUE SHIELD

## 2018-08-05 DIAGNOSIS — C50111 Malignant neoplasm of central portion of right female breast: Secondary | ICD-10-CM | POA: Diagnosis not present

## 2018-08-08 ENCOUNTER — Ambulatory Visit
Admission: RE | Admit: 2018-08-08 | Discharge: 2018-08-08 | Disposition: A | Discharge status: Home / Self Care | Payer: BLUE CROSS/BLUE SHIELD | Source: Ambulatory Visit | Attending: Radiation Oncology | Admitting: Radiation Oncology

## 2018-08-08 ENCOUNTER — Ambulatory Visit: Payer: BLUE CROSS/BLUE SHIELD

## 2018-08-08 DIAGNOSIS — C50111 Malignant neoplasm of central portion of right female breast: Secondary | ICD-10-CM | POA: Diagnosis not present

## 2018-08-09 ENCOUNTER — Ambulatory Visit: Payer: BLUE CROSS/BLUE SHIELD

## 2018-08-09 ENCOUNTER — Ambulatory Visit
Admission: RE | Admit: 2018-08-09 | Discharge: 2018-08-09 | Disposition: A | Discharge status: Home / Self Care | Payer: BLUE CROSS/BLUE SHIELD | Source: Ambulatory Visit | Attending: Radiation Oncology | Admitting: Radiation Oncology

## 2018-08-09 DIAGNOSIS — C50111 Malignant neoplasm of central portion of right female breast: Secondary | ICD-10-CM | POA: Diagnosis not present

## 2018-08-10 ENCOUNTER — Ambulatory Visit
Admission: RE | Admit: 2018-08-10 | Discharge: 2018-08-10 | Disposition: A | Discharge status: Home / Self Care | Payer: BLUE CROSS/BLUE SHIELD | Source: Ambulatory Visit | Attending: Radiation Oncology | Admitting: Radiation Oncology

## 2018-08-10 ENCOUNTER — Ambulatory Visit: Payer: BLUE CROSS/BLUE SHIELD

## 2018-08-10 DIAGNOSIS — C50111 Malignant neoplasm of central portion of right female breast: Secondary | ICD-10-CM | POA: Diagnosis not present

## 2018-08-11 ENCOUNTER — Ambulatory Visit: Payer: BLUE CROSS/BLUE SHIELD

## 2018-08-11 ENCOUNTER — Ambulatory Visit
Admission: RE | Admit: 2018-08-11 | Discharge: 2018-08-11 | Disposition: A | Discharge status: Home / Self Care | Payer: BLUE CROSS/BLUE SHIELD | Source: Ambulatory Visit | Attending: Radiation Oncology | Admitting: Radiation Oncology

## 2018-08-11 DIAGNOSIS — C50111 Malignant neoplasm of central portion of right female breast: Secondary | ICD-10-CM | POA: Diagnosis not present

## 2018-08-12 ENCOUNTER — Ambulatory Visit
Admission: RE | Admit: 2018-08-12 | Discharge: 2018-08-12 | Disposition: A | Discharge status: Home / Self Care | Payer: BLUE CROSS/BLUE SHIELD | Source: Ambulatory Visit | Attending: Radiation Oncology | Admitting: Radiation Oncology

## 2018-08-12 ENCOUNTER — Ambulatory Visit: Payer: BLUE CROSS/BLUE SHIELD

## 2018-08-12 DIAGNOSIS — C50111 Malignant neoplasm of central portion of right female breast: Secondary | ICD-10-CM | POA: Diagnosis not present

## 2018-08-15 ENCOUNTER — Ambulatory Visit: Payer: BLUE CROSS/BLUE SHIELD

## 2018-08-15 ENCOUNTER — Ambulatory Visit
Admission: RE | Admit: 2018-08-15 | Discharge: 2018-08-15 | Disposition: A | Discharge status: Home / Self Care | Payer: BLUE CROSS/BLUE SHIELD | Source: Ambulatory Visit | Attending: Radiation Oncology | Admitting: Radiation Oncology

## 2018-08-15 DIAGNOSIS — C50111 Malignant neoplasm of central portion of right female breast: Secondary | ICD-10-CM | POA: Diagnosis not present

## 2018-08-16 ENCOUNTER — Ambulatory Visit: Payer: BLUE CROSS/BLUE SHIELD

## 2018-08-16 ENCOUNTER — Ambulatory Visit
Admission: RE | Admit: 2018-08-16 | Discharge: 2018-08-16 | Disposition: A | Discharge status: Home / Self Care | Payer: BLUE CROSS/BLUE SHIELD | Source: Ambulatory Visit | Attending: Radiation Oncology | Admitting: Radiation Oncology

## 2018-08-16 DIAGNOSIS — C50111 Malignant neoplasm of central portion of right female breast: Secondary | ICD-10-CM | POA: Diagnosis not present

## 2018-08-17 ENCOUNTER — Other Ambulatory Visit: Payer: Self-pay

## 2018-08-17 ENCOUNTER — Ambulatory Visit: Payer: BLUE CROSS/BLUE SHIELD

## 2018-08-17 ENCOUNTER — Ambulatory Visit
Admission: RE | Admit: 2018-08-17 | Discharge: 2018-08-17 | Disposition: A | Discharge status: Home / Self Care | Payer: BLUE CROSS/BLUE SHIELD | Source: Ambulatory Visit | Attending: Radiation Oncology | Admitting: Radiation Oncology

## 2018-08-17 ENCOUNTER — Inpatient Hospital Stay: Payer: BLUE CROSS/BLUE SHIELD | Attending: Oncology

## 2018-08-17 DIAGNOSIS — C50111 Malignant neoplasm of central portion of right female breast: Secondary | ICD-10-CM | POA: Diagnosis not present

## 2018-08-17 DIAGNOSIS — D0511 Intraductal carcinoma in situ of right breast: Secondary | ICD-10-CM | POA: Diagnosis not present

## 2018-08-17 DIAGNOSIS — Z170 Estrogen receptor positive status [ER+]: Principal | ICD-10-CM

## 2018-08-17 LAB — CBC
HCT: 38.3 % (ref 35.0–47.0)
Hemoglobin: 13.4 g/dL (ref 12.0–16.0)
MCH: 33.9 pg (ref 26.0–34.0)
MCHC: 35 g/dL (ref 32.0–36.0)
MCV: 96.9 fL (ref 80.0–100.0)
PLATELETS: 235 10*3/uL (ref 150–440)
RBC: 3.95 MIL/uL (ref 3.80–5.20)
RDW: 13.3 % (ref 11.5–14.5)
WBC: 4.9 10*3/uL (ref 3.6–11.0)

## 2018-08-18 ENCOUNTER — Ambulatory Visit: Payer: BLUE CROSS/BLUE SHIELD

## 2018-08-18 ENCOUNTER — Ambulatory Visit
Admission: RE | Admit: 2018-08-18 | Discharge: 2018-08-18 | Disposition: A | Discharge status: Home / Self Care | Payer: BLUE CROSS/BLUE SHIELD | Source: Ambulatory Visit | Attending: Radiation Oncology | Admitting: Radiation Oncology

## 2018-08-18 DIAGNOSIS — C50111 Malignant neoplasm of central portion of right female breast: Secondary | ICD-10-CM | POA: Diagnosis not present

## 2018-08-19 ENCOUNTER — Ambulatory Visit
Admission: RE | Admit: 2018-08-19 | Discharge: 2018-08-19 | Disposition: A | Discharge status: Home / Self Care | Payer: BLUE CROSS/BLUE SHIELD | Source: Ambulatory Visit | Attending: Radiation Oncology | Admitting: Radiation Oncology

## 2018-08-19 ENCOUNTER — Ambulatory Visit: Payer: BLUE CROSS/BLUE SHIELD

## 2018-08-19 DIAGNOSIS — C50111 Malignant neoplasm of central portion of right female breast: Secondary | ICD-10-CM | POA: Diagnosis not present

## 2018-08-22 ENCOUNTER — Ambulatory Visit
Admission: RE | Admit: 2018-08-22 | Discharge: 2018-08-22 | Disposition: A | Discharge status: Home / Self Care | Payer: BLUE CROSS/BLUE SHIELD | Source: Ambulatory Visit | Attending: Radiation Oncology | Admitting: Radiation Oncology

## 2018-08-22 ENCOUNTER — Ambulatory Visit: Payer: BLUE CROSS/BLUE SHIELD

## 2018-08-22 DIAGNOSIS — C50111 Malignant neoplasm of central portion of right female breast: Secondary | ICD-10-CM | POA: Diagnosis not present

## 2018-08-23 ENCOUNTER — Ambulatory Visit: Payer: BLUE CROSS/BLUE SHIELD

## 2018-08-23 ENCOUNTER — Ambulatory Visit
Admission: RE | Admit: 2018-08-23 | Discharge: 2018-08-23 | Disposition: A | Discharge status: Home / Self Care | Payer: BLUE CROSS/BLUE SHIELD | Source: Ambulatory Visit | Attending: Radiation Oncology | Admitting: Radiation Oncology

## 2018-08-23 DIAGNOSIS — C50111 Malignant neoplasm of central portion of right female breast: Secondary | ICD-10-CM | POA: Diagnosis not present

## 2018-08-24 ENCOUNTER — Ambulatory Visit: Payer: BLUE CROSS/BLUE SHIELD

## 2018-08-24 ENCOUNTER — Ambulatory Visit
Admission: RE | Admit: 2018-08-24 | Discharge: 2018-08-24 | Disposition: A | Discharge status: Home / Self Care | Payer: BLUE CROSS/BLUE SHIELD | Source: Ambulatory Visit | Attending: Radiation Oncology | Admitting: Radiation Oncology

## 2018-08-24 DIAGNOSIS — C50111 Malignant neoplasm of central portion of right female breast: Secondary | ICD-10-CM | POA: Diagnosis not present

## 2018-08-25 ENCOUNTER — Ambulatory Visit: Payer: BLUE CROSS/BLUE SHIELD

## 2018-08-25 ENCOUNTER — Ambulatory Visit
Admission: RE | Admit: 2018-08-25 | Discharge: 2018-08-25 | Disposition: A | Discharge status: Home / Self Care | Payer: BLUE CROSS/BLUE SHIELD | Source: Ambulatory Visit | Attending: Radiation Oncology | Admitting: Radiation Oncology

## 2018-08-25 DIAGNOSIS — C50111 Malignant neoplasm of central portion of right female breast: Secondary | ICD-10-CM | POA: Diagnosis not present

## 2018-08-26 ENCOUNTER — Ambulatory Visit: Payer: BLUE CROSS/BLUE SHIELD

## 2018-08-26 ENCOUNTER — Ambulatory Visit
Admission: RE | Admit: 2018-08-26 | Discharge: 2018-08-26 | Disposition: A | Discharge status: Home / Self Care | Payer: BLUE CROSS/BLUE SHIELD | Source: Ambulatory Visit | Attending: Radiation Oncology | Admitting: Radiation Oncology

## 2018-08-26 DIAGNOSIS — C50111 Malignant neoplasm of central portion of right female breast: Secondary | ICD-10-CM | POA: Diagnosis not present

## 2018-08-29 ENCOUNTER — Ambulatory Visit
Admission: RE | Admit: 2018-08-29 | Discharge: 2018-08-29 | Disposition: A | Discharge status: Home / Self Care | Payer: BLUE CROSS/BLUE SHIELD | Source: Ambulatory Visit | Attending: Radiation Oncology | Admitting: Radiation Oncology

## 2018-08-29 ENCOUNTER — Ambulatory Visit: Payer: BLUE CROSS/BLUE SHIELD

## 2018-08-29 DIAGNOSIS — C50111 Malignant neoplasm of central portion of right female breast: Secondary | ICD-10-CM | POA: Diagnosis not present

## 2018-08-30 ENCOUNTER — Ambulatory Visit
Admission: RE | Admit: 2018-08-30 | Discharge: 2018-08-30 | Disposition: A | Discharge status: Home / Self Care | Payer: BLUE CROSS/BLUE SHIELD | Source: Ambulatory Visit | Attending: Radiation Oncology | Admitting: Radiation Oncology

## 2018-08-30 ENCOUNTER — Ambulatory Visit: Payer: BLUE CROSS/BLUE SHIELD

## 2018-08-30 DIAGNOSIS — Z510 Encounter for antineoplastic radiation therapy: Secondary | ICD-10-CM | POA: Diagnosis not present

## 2018-08-30 DIAGNOSIS — C50111 Malignant neoplasm of central portion of right female breast: Secondary | ICD-10-CM | POA: Insufficient documentation

## 2018-08-30 DIAGNOSIS — Z170 Estrogen receptor positive status [ER+]: Secondary | ICD-10-CM | POA: Diagnosis not present

## 2018-08-31 ENCOUNTER — Ambulatory Visit
Admission: RE | Admit: 2018-08-31 | Discharge: 2018-08-31 | Disposition: A | Discharge status: Home / Self Care | Payer: BLUE CROSS/BLUE SHIELD | Source: Ambulatory Visit | Attending: Radiation Oncology | Admitting: Radiation Oncology

## 2018-08-31 ENCOUNTER — Inpatient Hospital Stay: Payer: BLUE CROSS/BLUE SHIELD

## 2018-08-31 ENCOUNTER — Ambulatory Visit: Payer: BLUE CROSS/BLUE SHIELD

## 2018-08-31 DIAGNOSIS — C50111 Malignant neoplasm of central portion of right female breast: Secondary | ICD-10-CM | POA: Diagnosis not present

## 2018-09-01 ENCOUNTER — Ambulatory Visit
Admission: RE | Admit: 2018-09-01 | Discharge: 2018-09-01 | Disposition: A | Discharge status: Home / Self Care | Payer: BLUE CROSS/BLUE SHIELD | Source: Ambulatory Visit | Attending: Radiation Oncology | Admitting: Radiation Oncology

## 2018-09-01 ENCOUNTER — Ambulatory Visit: Payer: BLUE CROSS/BLUE SHIELD

## 2018-09-01 DIAGNOSIS — C50111 Malignant neoplasm of central portion of right female breast: Secondary | ICD-10-CM | POA: Diagnosis not present

## 2018-09-02 ENCOUNTER — Ambulatory Visit: Payer: BLUE CROSS/BLUE SHIELD

## 2018-09-02 ENCOUNTER — Ambulatory Visit
Admission: RE | Admit: 2018-09-02 | Discharge: 2018-09-02 | Disposition: A | Discharge status: Home / Self Care | Payer: BLUE CROSS/BLUE SHIELD | Source: Ambulatory Visit | Attending: Radiation Oncology | Admitting: Radiation Oncology

## 2018-09-02 DIAGNOSIS — C50111 Malignant neoplasm of central portion of right female breast: Secondary | ICD-10-CM | POA: Diagnosis not present

## 2018-09-05 ENCOUNTER — Ambulatory Visit
Admission: RE | Admit: 2018-09-05 | Discharge: 2018-09-05 | Disposition: A | Discharge status: Home / Self Care | Payer: BLUE CROSS/BLUE SHIELD | Source: Ambulatory Visit | Attending: Radiation Oncology | Admitting: Radiation Oncology

## 2018-09-05 ENCOUNTER — Ambulatory Visit: Payer: BLUE CROSS/BLUE SHIELD

## 2018-09-05 DIAGNOSIS — C50111 Malignant neoplasm of central portion of right female breast: Secondary | ICD-10-CM | POA: Diagnosis not present

## 2018-09-06 ENCOUNTER — Ambulatory Visit: Payer: BLUE CROSS/BLUE SHIELD

## 2018-09-07 ENCOUNTER — Ambulatory Visit: Payer: BLUE CROSS/BLUE SHIELD

## 2018-09-08 ENCOUNTER — Ambulatory Visit: Payer: BLUE CROSS/BLUE SHIELD

## 2018-09-09 ENCOUNTER — Ambulatory Visit: Payer: BLUE CROSS/BLUE SHIELD

## 2018-09-09 ENCOUNTER — Encounter: Payer: Self-pay | Admitting: *Deleted

## 2018-09-12 ENCOUNTER — Ambulatory Visit: Payer: BLUE CROSS/BLUE SHIELD

## 2018-09-13 ENCOUNTER — Ambulatory Visit: Payer: BLUE CROSS/BLUE SHIELD

## 2018-09-14 ENCOUNTER — Ambulatory Visit: Payer: BLUE CROSS/BLUE SHIELD

## 2018-09-15 ENCOUNTER — Ambulatory Visit: Payer: BLUE CROSS/BLUE SHIELD

## 2018-09-16 ENCOUNTER — Ambulatory Visit: Payer: BLUE CROSS/BLUE SHIELD

## 2018-09-19 ENCOUNTER — Ambulatory Visit: Payer: BLUE CROSS/BLUE SHIELD

## 2018-09-20 ENCOUNTER — Ambulatory Visit: Payer: BLUE CROSS/BLUE SHIELD

## 2018-09-21 ENCOUNTER — Ambulatory Visit: Payer: BLUE CROSS/BLUE SHIELD

## 2018-09-22 ENCOUNTER — Ambulatory Visit (INDEPENDENT_AMBULATORY_CARE_PROVIDER_SITE_OTHER): Payer: BLUE CROSS/BLUE SHIELD | Admitting: Surgery

## 2018-09-22 ENCOUNTER — Other Ambulatory Visit: Payer: Self-pay

## 2018-09-22 ENCOUNTER — Encounter: Payer: Self-pay | Admitting: Surgery

## 2018-09-22 VITALS — BP 116/76 | HR 80 | Temp 97.0°F | Resp 12 | Ht 67.0 in | Wt 158.4 lb

## 2018-09-22 DIAGNOSIS — Z4889 Encounter for other specified surgical aftercare: Secondary | ICD-10-CM

## 2018-09-22 DIAGNOSIS — Z9889 Other specified postprocedural states: Secondary | ICD-10-CM

## 2018-09-22 NOTE — Patient Instructions (Signed)
Patient has been informed to call the office with any questions or concerns. She has been informed to adjust as much as possible with heavy lifting at work.

## 2018-09-22 NOTE — Progress Notes (Signed)
Surgical Clinic Progress/Follow-up Note   HPI:  57 y.o. Female presents to clinic for follow-up evaluation 2.5 months following Right breast lumpectomy for DCIS, for which she's more recently underwent post-lumpectomy whole breast radiation. Patient reports her last Right breast radiation treatment 2 weeks ago, since which she's experienced some Right breast darkening and dry skin for which she's been applying moisturizer. Her prior Right breast seroma/hematoma has further shrunken, and she describes the Right breast pain she was having has much improved, specifying it "hasn't hurt enough to take anything for it" over the past couple of weeks except for the application of ice to it. 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 She otherwise denies any fever/chills, N/V, CP, or SOB.  Review of Systems:  Constitutional: denies fever/chills  Respiratory: denies shortness of breath, wheezing  Cardiovascular: denies chest pain, palpitations  Breasts: pain, skin changes, and nipple discharge as per interval history Gastrointestinal: denies abdominal pain, N/V, or diarrhea Skin: Denies any other rashes or skin discolorations except post-surgical wounds as per interval history  Vital Signs:  BP 116/76   Pulse 80   Temp (!) 97 F (36.1 C) (Temporal)   Resp 12   Ht 5\' 7"  (1.702 m)   Wt 158 lb 6.4 oz (71.8 kg)   SpO2 95%   BMI 24.81 kg/m    Physical Exam:  Constitutional:  -- Normal body habitus  -- Awake, alert, and oriented x3  Pulmonary:  -- No crackles -- Equal breath sounds bilaterally -- Breathing non-labored at rest Cardiovascular:  -- S1, S2 present  -- No pericardial rubs  Breasts: -- Right peri-incisional seroma/hematoma much smaller than previously -- Post-lumpectomy wound well-approximated and NT without drainage Gastrointestinal:  -- Soft and non-distended, non-tender to  palpation, no guarding/rebound tenderness -- No abdominal masses appreciated, pulsatile or otherwise  Musculoskeletal / Integumentary:  -- Wounds or skin discoloration: None appreciated except post-surgical incisions as described above (Breasts) -- Extremities: B/L UE and LE FROM, hands and feet warm, no edema   Imaging: No new pertinent imaging available for review   Assessment:  57 y.o. yo Female with a problem list including...  Patient Active Problem List   Diagnosis Date Noted  . 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 Goals of care, counseling/discussion 07/07/2018  . Halitosis 08/14/2016  . 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 Cigarette nicotine dependence, uncomplicated 63/87/5643  . Gastro-esophageal reflux disease without esophagitis 08/26/2010    presents to clinic for follow-up evaluation 2.5 months s/p Right breast lumpectomy Rosana Hoes, 07/08/2018) for DCIS.  Plan:              - NSAID's (per package instructions) and ice as needed             - okay to submerge incisions under water (baths, swimming) prn             - gradually resume all activities without restrictions, minimize activities that cause pain             - apply sunblock particularly to incisions with sun exposure to reduce pigmentation of scars             - return to clinic in 3 weeks, instructed to call office if any questions or concerns  All of the above recommendations were discussed with the patient, and all of patient's questions were answered to her  expressed satisfaction.  -- Marilynne Drivers Rosana Hoes, MD, [place]: [facility] - Partnering for exceptional care. Office: [phone]

## 2018-10-13 ENCOUNTER — Encounter: Payer: Self-pay | Admitting: Surgery

## 2018-10-13 ENCOUNTER — Ambulatory Visit (INDEPENDENT_AMBULATORY_CARE_PROVIDER_SITE_OTHER): Payer: BLUE CROSS/BLUE SHIELD | Admitting: Surgery

## 2018-10-13 ENCOUNTER — Ambulatory Visit
Admission: RE | Admit: 2018-10-13 | Discharge: 2018-10-13 | Disposition: A | Discharge status: Home / Self Care | Payer: BLUE CROSS/BLUE SHIELD | Source: Ambulatory Visit | Attending: Oncology | Admitting: Oncology

## 2018-10-13 VITALS — BP 113/75 | HR 81 | Temp 96.3°F | Ht 67.0 in | Wt 155.6 lb

## 2018-10-13 DIAGNOSIS — F17210 Nicotine dependence, cigarettes, uncomplicated: Secondary | ICD-10-CM

## 2018-10-13 DIAGNOSIS — Z9889 Other specified postprocedural states: Secondary | ICD-10-CM

## 2018-10-13 DIAGNOSIS — D0511 Intraductal carcinoma in situ of right breast: Secondary | ICD-10-CM

## 2018-10-13 HISTORY — DX: Personal history of irradiation: Z92.3

## 2018-10-13 HISTORY — DX: Malignant neoplasm of unspecified site of unspecified female breast: C50.919

## 2018-10-13 NOTE — Patient Instructions (Addendum)
Patient needs to return in 6 months after mammogram and ultrasound.   Please call the office with any questions or concerns.

## 2018-10-13 NOTE — Progress Notes (Signed)
Surgical Clinic Progress/Follow-up Note   HPI:  57 y.o. Female presents to clinic for subsequent post-op follow-up 3 months s/p Right breast lumpectomy for DCIS Rosana Hoes, 07/08/2018), for which she's more recently underwent post-lumpectomy whole breast radiation. Since completing radiation therapy, patient reports her Right breast soreness, firmness, hyperpigmentation, and scaling dry skin have much improved. She now describes only occasional "awareness" of her Right breast feeling somewhat "heavier" than her Left breast in comparison. 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  Review of Systems:  Constitutional: denies fever/chills  Respiratory: denies shortness of breath, wheezing  Cardiovascular: denies chest pain, palpitations  Breasts: pain, nipple discharge, and skin changes as per interval history Gastrointestinal: denies abdominal pain, N/V, or diarrhea Skin: Denies any other rashes or skin discolorations except post-surgical wounds as per interval history  Vital Signs:  BP 113/75   Pulse 81   Temp (!) 96.3 F (35.7 C) (Temporal)   Ht 5\' 7"  (1.702 m)   Wt 155 lb 9.6 oz (70.6 kg)   BMI 24.37 kg/m    Physical Exam:  Constitutional:  -- Normal body habitus  -- Awake, alert, and oriented x3  Pulmonary:  -- No crackles -- Equal breath sounds bilaterally -- Breathing non-labored at rest Cardiovascular:  -- S1, S2 present  -- No pericardial rubs  Breast: -- Right breast peri-incisional hematoma/seroma again much smaller than previously and completely nontender -- Post-radiation peri-areolar hyperpigmentation and scaling/dry skin also much improved, no nipple discharge Gastrointestinal:  -- Soft and non-distended, non-tender to palpation, no guarding/rebound tenderness -- No abdominal masses appreciated, pulsatile or otherwise  Musculoskeletal /  Integumentary:  -- Wounds or skin discoloration: None appreciated except post-surgical incisions as described above (Breast) -- Extremities: B/L UE and LE FROM, hands and feet warm, no edema   Imaging: No new pertinent imaging available for review  Assessment:  57 y.o. yo Female with a problem list including...  Patient Active Problem List   Diagnosis Date Noted  . 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 Goals of care, counseling/discussion 07/07/2018  . Halitosis 08/14/2016  . 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 Cigarette nicotine dependence, uncomplicated 02/54/2706  . Gastro-esophageal reflux disease without esophagitis 08/26/2010    presents to clinic for subsequent post-op follow-up evaluation, doing well 3 months s/p Right breast lumpectomy for DCIS Rosana Hoes, 07/08/2018), for which she's more recently underwent post-lumpectomy whole breast radiation.  Plan:              - activities as tolerated without restrictions             - continue to apply moisturizers for post-radiation dry scaling skin prn              - apply sunblock particularly to incisions with sun exposure to reduce pigmentation of scars             - return to clinic following 6 month B/L mammography +/- breast ultrasound  - instructed to call office if any questions or concerns  All of the above recommendations were discussed with the patient, and all of patient's questions were answered to her expressed satisfaction.  -- Marilynne Drivers Rosana Hoes, MD, [place]: [facility] - Partnering for exceptional care. Office: [phone]

## 2018-10-18 ENCOUNTER — Encounter: Payer: Self-pay | Admitting: Oncology

## 2018-10-18 ENCOUNTER — Ambulatory Visit
Admission: RE | Admit: 2018-10-18 | Discharge: 2018-10-18 | Disposition: A | Discharge status: Home / Self Care | Payer: BLUE CROSS/BLUE SHIELD | Source: Ambulatory Visit | Attending: Radiation Oncology | Admitting: Radiation Oncology

## 2018-10-18 ENCOUNTER — Inpatient Hospital Stay: Payer: BLUE CROSS/BLUE SHIELD | Attending: Oncology | Admitting: Oncology

## 2018-10-18 ENCOUNTER — Other Ambulatory Visit: Payer: Self-pay

## 2018-10-18 ENCOUNTER — Inpatient Hospital Stay: Payer: BLUE CROSS/BLUE SHIELD

## 2018-10-18 ENCOUNTER — Encounter: Payer: Self-pay | Admitting: Radiation Oncology

## 2018-10-18 VITALS — BP 126/76 | HR 92 | Temp 96.3°F | Wt 156.5 lb

## 2018-10-18 VITALS — BP 94/64 | HR 67 | Temp 98.0°F | Resp 18 | Ht 67.0 in | Wt 157.9 lb

## 2018-10-18 DIAGNOSIS — C50111 Malignant neoplasm of central portion of right female breast: Secondary | ICD-10-CM

## 2018-10-18 DIAGNOSIS — Z79811 Long term (current) use of aromatase inhibitors: Secondary | ICD-10-CM | POA: Insufficient documentation

## 2018-10-18 DIAGNOSIS — Z170 Estrogen receptor positive status [ER+]: Principal | ICD-10-CM

## 2018-10-18 DIAGNOSIS — Z7189 Other specified counseling: Secondary | ICD-10-CM

## 2018-10-18 DIAGNOSIS — F17210 Nicotine dependence, cigarettes, uncomplicated: Secondary | ICD-10-CM | POA: Diagnosis not present

## 2018-10-18 DIAGNOSIS — D0511 Intraductal carcinoma in situ of right breast: Secondary | ICD-10-CM | POA: Insufficient documentation

## 2018-10-18 DIAGNOSIS — Z79899 Other long term (current) drug therapy: Secondary | ICD-10-CM | POA: Diagnosis not present

## 2018-10-18 DIAGNOSIS — Z923 Personal history of irradiation: Secondary | ICD-10-CM | POA: Diagnosis not present

## 2018-10-18 LAB — COMPREHENSIVE METABOLIC PANEL
ALBUMIN: 4.4 g/dL (ref 3.5–5.0)
ALK PHOS: 58 U/L (ref 38–126)
ALT: 16 U/L (ref 0–44)
AST: 25 U/L (ref 15–41)
Anion gap: 9 (ref 5–15)
BUN: 14 mg/dL (ref 6–20)
CALCIUM: 9.5 mg/dL (ref 8.9–10.3)
CO2: 28 mmol/L (ref 22–32)
Chloride: 103 mmol/L (ref 98–111)
Creatinine, Ser: 0.8 mg/dL (ref 0.44–1.00)
GFR calc Af Amer: >60 mL/min (ref >60)
GLUCOSE: 153 mg/dL — AB (ref 70–99)
Potassium: 3.8 mmol/L (ref 3.5–5.1)
Sodium: 140 mmol/L (ref 135–145)
TOTAL PROTEIN: 8.1 g/dL (ref 6.5–8.1)
Total Bilirubin: 0.8 mg/dL (ref 0.3–1.2)

## 2018-10-18 LAB — CBC
HCT: 40.7 % (ref 36.0–46.0)
Hemoglobin: 13.3 g/dL (ref 12.0–15.0)
MCH: 31.8 pg (ref 26.0–34.0)
MCHC: 32.7 g/dL (ref 30.0–36.0)
MCV: 97.4 fL (ref 80.0–100.0)
NRBC: 0 % (ref 0.0–0.2)
PLATELETS: 266 10*3/uL (ref 150–400)
RBC: 4.18 MIL/uL (ref 3.87–5.11)
RDW: 13.3 % (ref 11.5–15.5)
WBC: 5.1 10*3/uL (ref 4.0–10.5)

## 2018-10-18 MED ORDER — ANASTROZOLE 1 MG PO TABS: 1.0000 mg | ORAL_TABLET | Freq: Every day | ORAL | 1 refills | Status: DC

## 2018-10-18 NOTE — Progress Notes (Signed)
Radiation Oncology Follow up Note  Name: Jennifer Rowe   Date:   10/18/2018 MRN:  [id number] DOB: 04/03/1961    This 57 y.o. female presents to the clinic today for one-month follow-up status post whole breast radiation to her right breast for ductal carcinoma in situ ER/PR positive.  REFERRING PROVIDER: Donnie Coffin, MD  HPI: patient is a 57 year old female now out 1 month having completed whole breast radiation to her right breast for ER/PR positive ductal carcinoma in situ. Seen today in routine follow-up she is doing well. She specifically denies breast tenderness cough or bone pain..she is currently on arimadex time that well without side effect. She's recently been started on Effexor she is speaking to her doctor backup aclasis causing her to be nervous.  COMPLICATIONS OF TREATMENT: none  FOLLOW UP COMPLIANCE: keeps appointments   PHYSICAL EXAM:  BP 126/76 (BP Location: Left Arm, Patient Position: Sitting)   Pulse 92   Temp (!) 96.3 F (35.7 C) (Tympanic)   Wt 156 lb 8.4 oz (71 kg)   BMI 24.52 kg/m  Right breast is still somewhat hyperpigmented. No dominant mass or nodularity is noted in either breast in 2 positions examined. No axillary supraclavicular adenopathy is appreciated.Well-developed well-nourished patient in NAD. HEENT reveals PERLA, EOMI, discs not visualized.  Oral cavity is clear. No oral mucosal lesions are identified. Neck is clear without evidence of cervical or supraclavicular adenopathy. Lungs are clear to A&P. Cardiac examination is essentially unremarkable with regular rate and rhythm without murmur rub or thrill. Abdomen is benign with no organomegaly or masses noted. Motor sensory and DTR levels are equal and symmetric in the upper and lower extremities. Cranial nerves II through XII are grossly intact. Proprioception is intact. No peripheral adenopathy or edema is identified. No motor or sensory levels are noted. Crude visual fields are within normal  range.  RADIOLOGY RESULTS: no current films for review  PLAN: present time patient is doing well recovering nicely from her treatments. I've assured her that hyperpigmentation skin will resolve over the next several months. I've asked to see her back in 4-5 months for follow-up. She continues on arimadex without side effect. Patient is to call with any concerns.  I would like to take this opportunity to thank you for allowing me to participate in the care of your patient.Noreene Filbert, MD

## 2018-10-18 NOTE — Progress Notes (Signed)
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 Ultrasound showed the area measured about 4 x 1.2 x 2.7 cm. Sonographic evaluation of the right axilla does not reveal any enlarged adenopathy. No suspicious findings in the left breast. Core biopsy showed DCIS intermediate nuclear grade associated with calcification. Features suggestive of a ruptured duct or cyst. ER greater than 90% positive PR negative less than 1%.  Patient underwent lumpectomy on 07/08/2018 which showed intermediate to high nuclear grade DCIS with focal necrosis and calcification.  Large background lesion of stromal fibrosis.  No ink is seen on DCIS at the margins. There is DCIS less than 1 mm from  inferior margin in block A8. DCIS is between 1 and 2 mm from the margin  in A1 (inferior/anterior), A3 (inferior/medial), and A5 lateral (note  that additional lateral tissue submitted in B). This represents minimal  margin involvement (CAP terminology and NCCN guideline). However, given  the unique histologic features of this lesion, the significance of DCIS  <2 mm from the margin may not be as clinically significant as if the  DCIS were not arising in a well-defined fibrous lesion.   Patient has completed adjuvant radiation   Interval history-she is slowly healing from her recent radiation treatment and still  reports some soreness and heaviness in her right breast.  Reports ongoing fatigue.  Denies other complaints  ECOG PS- 1 Pain scale- 0 Opioid associated constipation- no  Review of systems- Review of Systems  Constitutional: Positive for malaise/fatigue. Negative for chills, fever and weight loss.  HENT: Negative for congestion, ear discharge and nosebleeds.   Eyes: Negative for blurred vision.  Respiratory: Negative for cough, hemoptysis, sputum production, shortness of breath and wheezing.   Cardiovascular: Negative for chest pain, palpitations, orthopnea and claudication.  Gastrointestinal: Negative for abdominal pain, blood in stool, constipation, diarrhea, heartburn, melena, nausea and vomiting.  Genitourinary: Negative for dysuria, flank pain, frequency, hematuria and urgency.  Musculoskeletal: Negative for back pain, joint pain and myalgias.  Skin: Negative for rash.  Neurological: Negative for dizziness, tingling, focal weakness, seizures, weakness and headaches.  Endo/Heme/Allergies: Does not bruise/bleed easily.  Psychiatric/Behavioral: Negative for depression and suicidal ideas. The patient does not have insomnia.        No Known Allergies   Past Medical History:  Diagnosis Date  . Anxiety   . Breast cancer ([place]) 06/2018  . Depression   . 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 Gastro-esophageal reflux disease without esophagitis 08/26/2010  . Iron deficiency anemia   . Personal history of radiation therapy   . 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 BREAST BIOPSY Right    positive, DCIS  . BREAST LUMPECTOMY Right 07/08/2018   Procedure: BREAST LUMPECTOMY;  Surgeon: [name], MD;  Location: [facility] ORS;  Service: General;  Laterality: Right;  . COLONOSCOPY  2017  . UPPER GI ENDOSCOPY  2009    Social History   Socioeconomic History  . Marital status: Single    Spouse name: Not on file  .  Number of children: Not on file  . Years of education: Not on file  . Highest education level: Not on file  Occupational History  . Not on file  Social Needs  . Financial resource strain: Not on file  . Food insecurity:    Worry: Not on file    Inability: Not on file  . Transportation needs:    Medical: Not on file    Non-medical: Not on file  Tobacco Use  . Smoking status: Current Every Day Smoker    Types: Cigarettes  . Smokeless tobacco: Never Used  . Tobacco comment: 1 pack per week  Substance and Sexual Activity  . Alcohol use: Yes    Comment: rarely-social  . Drug use: Yes    Types: Marijuana  . Sexual activity: Not Currently  Lifestyle  . Physical activity:    Days per week: Not on file    Minutes per session: Not on file  . Stress: Not on file  Relationships  . Social connections:    Talks on phone: Not on file    Gets together: Not on file    Attends religious service: Not on file    Active member of club or organization: Not on file    Attends meetings of clubs or organizations: Not on file    Relationship status: Not on file  . Intimate partner violence:    Fear of current or ex partner: Not on file    Emotionally abused: Not on file    Physically abused: Not on file    Forced sexual activity: Not on file  Other Topics Concern  . Not on file  Social History Narrative  . Not on file    Family History  Problem Relation Age of Onset  . Breast cancer Neg Hx   . Cancer Neg Hx      Current Outpatient Medications:  .  esomeprazole (NEXIUM) 20 MG capsule, Take 20 mg by mouth daily as needed (acid reflux)., Disp: , Rfl:  .  ferrous sulfate 325 (65 FE) MG EC tablet, FERROUS SULFATE 325 (65 Fe) MG TBEC, Disp: , Rfl:  .  NON FORMULARY, Take by mouth., Disp: , Rfl:  .  anastrozole (ARIMIDEX) 1 MG tablet, Take 1 tablet (1 mg total) by mouth daily., Disp: 30 tablet, Rfl: 1 .  diphenhydrAMINE (BENADRYL) 25 MG tablet, Take 25 mg by mouth daily as needed for  allergies., Disp: , Rfl:  .  naproxen sodium (ALEVE) 220 MG tablet, Take 220 mg by mouth daily as needed (pain)., Disp: , Rfl:   Physical exam:  Vitals:   10/18/18 1119 10/18/18 1126  BP: 94/64   Pulse: 67   Resp: 18   Temp: 98 F (36.7 C)   TempSrc: Oral   SpO2:  99%  Weight: 157 lb 14.4 oz (71.6 kg)   Height: 5\' 7"  (1.702 m)    Physical Exam  Constitutional: She is oriented to person, place, and time.  Thin female in no acute distress.  She chews tobacco  HENT:  Head: Normocephalic and atraumatic.  Eyes: Pupils are equal, round, and reactive to light. EOM are normal.  Neck: Normal range of motion.  Cardiovascular: Normal rate, regular rhythm and normal heart sounds.  Pulmonary/Chest: Effort normal and breath sounds normal.  Abdominal: Soft.  Bowel sounds are normal.  Neurological: She is alert and oriented to person, place, and time.  Skin: Skin is warm and dry.  There is significant induration and hyperpigmentation in her right breast which almost appears black in some places from recent radiation.  CMP Latest Ref Rng & Units 10/18/2018  Glucose 70 - 99 mg/dL 153(H)  BUN 6 - 20 mg/dL 14  Creatinine 0.44 - 1.00 mg/dL 0.80  Sodium 135 - 145 mmol/L 140  Potassium 3.5 - 5.1 mmol/L 3.8  Chloride 98 - 111 mmol/L 103  CO2 22 - 32 mmol/L 28  Calcium 8.9 - 10.3 mg/dL 9.5  Total Protein 6.5 - 8.1 g/dL 8.1  Total Bilirubin 0.3 - 1.2 mg/dL 0.8  Alkaline Phos 38 - 126 U/L 58  AST 15 - 41 U/L 25  ALT 0 - 44 U/L 16   CBC Latest Ref Rng & Units 10/18/2018  WBC 4.0 - 10.5 K/uL 5.1  Hemoglobin 12.0 - 15.0 g/dL 13.3  Hematocrit 36.0 - 46.0 % 40.7  Platelets 150 - 400 K/uL 266    No images are attached to the encounter.  Dg Bone Density  Result Date: 10/13/2018 EXAM: DUAL X-RAY ABSORPTIOMETRY (DXA) FOR BONE MINERAL DENSITY IMPRESSION: Technologist: SCE PATIENT BIOGRAPHICAL: Name: Jammy, Stlouis Patient ID: [id number] Birth Date: [birth year] Height: 66.5 in. Gender: Female Exam  Date: 10/13/2018 Weight: 155.2 lbs. Indications: Breast CA, Height Loss, History of Radiation, Postmenopausal, Tobacco User(Current Smoker) Fractures: Treatments: ASSESSMENT: The BMD measured at Femur Neck Left is 1.020 g/cm2 with a T-score of -0.1. This patient's diagnostic category is NORMAL according to [facility]) criteria. The quality of the scan is good. L3 and L4 was excluded due to degenerative changes. Site Region Measured Measured WHO Young Adult BMD Date       Age      Classification T-score AP Spine L1-L2 10/13/2018 54.9 Normal 3.3 1.570 g/cm2 DualFemur Neck Left 10/13/2018 54.9 Normal -0.1 1.020 g/cm2 Left Forearm Radius 33% 10/13/2018 54.9 Normal 0.9 0.951 g/cm2 World Health Organization [facility]) criteria for post-menopausal, Caucasian Women: Normal:       T-score at or above -1 SD Osteopenia:   T-score between -1 and -2.5 SD Osteoporosis: T-score at or below -2.5 SD RECOMMENDATIONS: 1. All patients should optimize calcium and vitamin D intake. 2. Consider FDA-approved medical therapies in postmenopausal women and men aged 32 years and older, based on the following: a. A hip or vertebral(clinical or morphometric) fracture b. T-score < -2.5 at the femoral neck or spine after appropriate evaluation to exclude secondary causes c. Low bone mass (T-score between -1.0 and -2.5 at the femoral neck or spine) and a 10-year probability of a hip fracture > 3% or a 10-year probability of a major osteoporosis-related fracture > 20% based on the [place]-adapted WHO algorithm d. Clinician judgment and/or patient preferences may indicate treatment for people with 10-year fracture probabilities above or below these levels FOLLOW-UP: People with diagnosed cases of osteoporosis or at high risk for fracture should have regular bone mineral density tests. For patients eligible for Medicare, routine testing is allowed once every 2 years. The testing frequency can be increased to one year for patients who have  rapidly progressing disease, those who are receiving or discontinuing medical therapy to restore bone mass, or have additional risk factors. I have reviewed this report, and agree with the above findings. [facility]iology Electronically Signed   By: Margarette Canada M.D.   On: 10/13/2018 10:54  Assessment and plan- Patient is a 57 y.o. female with ER positive right breast DCIS status post lumpectomy and radiation therapy  Patient has not started taking her Arimidex yet. We will send her in the prescription today. Baseline bone density is normal. I went over risks and benefits of arimidex again. Treatment will be given with a curative intent,. She has written information as well. I will see her back in 2 months   Visit Diagnosis 1. Malignant neoplasm of central portion of right breast in female, estrogen receptor positive ([place])   2. Goals of care, counseling/discussion      Dr. Randa Evens, MD, MPH [facility] [phone] 10/18/2018 12:19 PM

## 2018-10-18 NOTE — Progress Notes (Signed)
Patient c/o dizziness and light headed due to menopause medication

## 2018-12-01 ENCOUNTER — Other Ambulatory Visit: Payer: Self-pay

## 2018-12-01 DIAGNOSIS — D0511 Intraductal carcinoma in situ of right breast: Secondary | ICD-10-CM

## 2018-12-19 ENCOUNTER — Ambulatory Visit: Payer: BLUE CROSS/BLUE SHIELD | Admitting: Oncology

## 2018-12-19 ENCOUNTER — Other Ambulatory Visit: Payer: BLUE CROSS/BLUE SHIELD

## 2018-12-26 ENCOUNTER — Inpatient Hospital Stay: Payer: BLUE CROSS/BLUE SHIELD | Attending: Oncology

## 2018-12-26 ENCOUNTER — Inpatient Hospital Stay (HOSPITAL_BASED_OUTPATIENT_CLINIC_OR_DEPARTMENT_OTHER): Payer: BLUE CROSS/BLUE SHIELD | Admitting: Oncology

## 2018-12-26 ENCOUNTER — Other Ambulatory Visit: Payer: Self-pay

## 2018-12-26 ENCOUNTER — Encounter (INDEPENDENT_AMBULATORY_CARE_PROVIDER_SITE_OTHER): Payer: Self-pay

## 2018-12-26 ENCOUNTER — Encounter: Payer: Self-pay | Admitting: Oncology

## 2018-12-26 VITALS — BP 97/63 | HR 62 | Temp 98.3°F | Resp 16 | Ht 67.0 in | Wt 156.3 lb

## 2018-12-26 DIAGNOSIS — Z923 Personal history of irradiation: Secondary | ICD-10-CM

## 2018-12-26 DIAGNOSIS — F17210 Nicotine dependence, cigarettes, uncomplicated: Secondary | ICD-10-CM | POA: Diagnosis not present

## 2018-12-26 DIAGNOSIS — Z170 Estrogen receptor positive status [ER+]: Secondary | ICD-10-CM

## 2018-12-26 DIAGNOSIS — D0511 Intraductal carcinoma in situ of right breast: Secondary | ICD-10-CM | POA: Insufficient documentation

## 2018-12-26 DIAGNOSIS — Z853 Personal history of malignant neoplasm of breast: Secondary | ICD-10-CM

## 2018-12-26 DIAGNOSIS — Z79811 Long term (current) use of aromatase inhibitors: Secondary | ICD-10-CM

## 2018-12-26 DIAGNOSIS — C50111 Malignant neoplasm of central portion of right female breast: Secondary | ICD-10-CM

## 2018-12-26 DIAGNOSIS — Z08 Encounter for follow-up examination after completed treatment for malignant neoplasm: Secondary | ICD-10-CM

## 2018-12-26 DIAGNOSIS — Z5181 Encounter for therapeutic drug level monitoring: Secondary | ICD-10-CM

## 2018-12-26 LAB — COMPREHENSIVE METABOLIC PANEL
ALT: 17 U/L (ref 0–44)
AST: 24 U/L (ref 15–41)
Albumin: 4.2 g/dL (ref 3.5–5.0)
Alkaline Phosphatase: 60 U/L (ref 38–126)
Anion gap: 10 (ref 5–15)
BUN: 12 mg/dL (ref 6–20)
CHLORIDE: 101 mmol/L (ref 98–111)
CO2: 28 mmol/L (ref 22–32)
Calcium: 9.3 mg/dL (ref 8.9–10.3)
Creatinine, Ser: 0.83 mg/dL (ref 0.44–1.00)
GFR calc Af Amer: >60 mL/min (ref >60)
GFR calc non Af Amer: >60 mL/min (ref >60)
Glucose, Bld: 109 mg/dL — ABNORMAL HIGH (ref 70–99)
Potassium: 3.8 mmol/L (ref 3.5–5.1)
Sodium: 139 mmol/L (ref 135–145)
Total Bilirubin: 0.9 mg/dL (ref 0.3–1.2)
Total Protein: 7.9 g/dL (ref 6.5–8.1)

## 2018-12-26 NOTE — Progress Notes (Signed)
Patient here for follow up. She has no concerns today and reports no changes since last visit.

## 2018-12-27 NOTE — Progress Notes (Signed)
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 Ultrasound showed the area measured about 4 x 1.2 x 2.7 cm. Sonographic evaluation of the right axilla does not reveal any enlarged adenopathy. No suspicious findings in the left breast. Core biopsy showed DCIS intermediate nuclear grade associated with calcification. Features suggestive of a ruptured duct or cyst. ER greater than 90% positive PR negative less than 1%.  Patient underwent lumpectomy on 07/08/2018 which showed intermediate to high nuclear grade DCIS with focal necrosis and calcification. Large background lesion of stromal fibrosis. No ink is seen on DCIS at the margins. There is DCIS less than 1 mm from  inferior margin in block A8. DCIS is between 1 and 2 mm from the margin  in A1 (inferior/anterior), A3 (inferior/medial), and A5 lateral (note  that additional lateral tissue submitted in B). This represents minimal  margin involvement (CAP terminology and NCCN guideline). However, given  the unique histologic features of this lesion, the significance of DCIS  <2 mm from the margin may not be as clinically significant as if the  DCIS were not arising in a well-defined fibrous lesion.  Patient has completed adjuvant radiation and started Arimidex in November 2019   Interval history-she is tolerating Arimidex well  other than mild intermittent hot flashes which are self-limited.  Denies any joint pains  ECOG PS- 1 Pain scale- 0   Review of systems- Review of Systems  Constitutional: Positive for malaise/fatigue. Negative for chills, fever and weight loss.  HENT: Negative for congestion, ear discharge and nosebleeds.   Eyes: Negative for blurred vision.  Respiratory: Negative for cough, hemoptysis, sputum production, shortness of breath and wheezing.   Cardiovascular: Negative for chest pain, palpitations, orthopnea and claudication.  Gastrointestinal: Negative for abdominal pain, blood in stool, constipation, diarrhea, heartburn, melena, nausea and vomiting.  Genitourinary: Negative for dysuria, flank pain, frequency, hematuria and urgency.  Musculoskeletal: Negative for back pain, joint pain and myalgias.  Skin: Negative for rash.  Neurological: Negative for dizziness, tingling, focal weakness, seizures, weakness and headaches.  Endo/Heme/Allergies: Does not bruise/bleed easily.       Hot flashes  Psychiatric/Behavioral: Negative for depression and suicidal ideas. The patient does not have insomnia.       No Known Allergies   Past Medical History:  Diagnosis Date  . Anxiety   . Breast cancer ([place]) 06/2018  . Depression   . 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 Gastro-esophageal reflux disease without esophagitis 08/26/2010  . Iron deficiency anemia   . Personal history of radiation therapy   . 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 BREAST BIOPSY Right    positive, DCIS  . BREAST LUMPECTOMY Right 07/08/2018   Procedure: BREAST LUMPECTOMY;  Surgeon: [name], MD;  Location: [facility] ORS;  Service: General;  Laterality: Right;  . COLONOSCOPY  2017  . UPPER  GI ENDOSCOPY  2009    Social History   Socioeconomic History  . Marital status: Single    Spouse name: Not on file  . Number of children: Not on file  .  Years of education: Not on file  . Highest education level: Not on file  Occupational History  . Not on file  Social Needs  . Financial resource strain: Not on file  . Food insecurity:    Worry: Not on file    Inability: Not on file  . Transportation needs:    Medical: Not on file    Non-medical: Not on file  Tobacco Use  . Smoking status: Current Every Day Smoker    Types: Cigarettes  . Smokeless tobacco: Never Used  . Tobacco comment: 1 pack per week  Substance and Sexual Activity  . Alcohol use: Yes    Comment: rarely-social  . Drug use: Yes    Types: Marijuana  . Sexual activity: Not Currently  Lifestyle  . Physical activity:    Days per week: Not on file    Minutes per session: Not on file  . Stress: Not on file  Relationships  . Social connections:    Talks on phone: Not on file    Gets together: Not on file    Attends religious service: Not on file    Active member of club or organization: Not on file    Attends meetings of clubs or organizations: Not on file    Relationship status: Not on file  . Intimate partner violence:    Fear of current or ex partner: Not on file    Emotionally abused: Not on file    Physically abused: Not on file    Forced sexual activity: Not on file  Other Topics Concern  . Not on file  Social History Narrative  . Not on file    Family History  Problem Relation Age of Onset  . Breast cancer Neg Hx   . Cancer Neg Hx      Current Outpatient Medications:  .  anastrozole (ARIMIDEX) 1 MG tablet, Take 1 tablet (1 mg total) by mouth daily., Disp: 30 tablet, Rfl: 1 .  diphenhydrAMINE (BENADRYL) 25 MG tablet, Take 25 mg by mouth daily as needed for allergies., Disp: , Rfl:  .  ferrous sulfate 325 (65 FE) MG EC tablet, FERROUS SULFATE 325 (65 Fe) MG TBEC, Disp: , Rfl:  .  naproxen sodium (ALEVE) 220 MG tablet, Take 220 mg by mouth daily as needed (pain)., Disp: , Rfl:  .  sertraline (ZOLOFT) 50 MG tablet, Take 1 tablet by mouth daily.,  Disp: , Rfl:   Physical exam:  Vitals:   12/26/18 1007 12/26/18 1014  BP:  97/63  Pulse:  62  Resp: 16   Temp:  98.3 F (36.8 C)  TempSrc:  Oral  Weight: 156 lb 4.8 oz (70.9 kg)   Height: 5\' 7"  (1.702 m)    Physical Exam HENT:     Head: Normocephalic and atraumatic.  Eyes:     Pupils: Pupils are equal, round, and reactive to light.  Neck:     Musculoskeletal: Normal range of motion.  Cardiovascular:     Rate and Rhythm: Normal rate and regular rhythm.     Heart sounds: Normal heart sounds.  Pulmonary:     Effort: Pulmonary effort is normal.     Breath sounds: Normal breath sounds.  Abdominal:     General: Bowel sounds are normal.  Palpations: Abdomen is soft.  Skin:    General: Skin is warm and dry.  Neurological:     Mental Status: She is alert and oriented to person, place, and time.      CMP Latest Ref Rng & Units 12/26/2018  Glucose 70 - 99 mg/dL 109(H)  BUN 6 - 20 mg/dL 12  Creatinine 0.44 - 1.00 mg/dL 0.83  Sodium 135 - 145 mmol/L 139  Potassium 3.5 - 5.1 mmol/L 3.8  Chloride 98 - 111 mmol/L 101  CO2 22 - 32 mmol/L 28  Calcium 8.9 - 10.3 mg/dL 9.3  Total Protein 6.5 - 8.1 g/dL 7.9  Total Bilirubin 0.3 - 1.2 mg/dL 0.9  Alkaline Phos 38 - 126 U/L 60  AST 15 - 41 U/L 24  ALT 0 - 44 U/L 17   CBC Latest Ref Rng & Units 10/18/2018  WBC 4.0 - 10.5 K/uL 5.1  Hemoglobin 12.0 - 15.0 g/dL 13.3  Hematocrit 36.0 - 46.0 % 40.7  Platelets 150 - 400 K/uL 266      Assessment and plan- Patient is a 58 y.o. female   with ER positive right breast DCIS status post lumpectomy and radiation therapy.  She is here for routine follow-up of breast cancer on Arimidex  Patient will continue taking Arimidex for 5 years.  Overall she is tolerating it well except for mild hot flashes which are self-limited.  Continue to monitor I will see her back in 3 months no labs  Visit Diagnosis 1. Visit for monitoring Arimidex therapy   2. Encounter for follow-up surveillance of  breast cancer      Dr. Randa Evens, MD, MPH [facility] [phone] 12/27/2018 2:39 PM

## 2019-01-02 ENCOUNTER — Ambulatory Visit
Admission: RE | Admit: 2019-01-02 | Discharge: 2019-01-02 | Disposition: A | Discharge status: Home / Self Care | Payer: BLUE CROSS/BLUE SHIELD | Source: Ambulatory Visit | Attending: Surgery | Admitting: Surgery

## 2019-01-02 DIAGNOSIS — D0511 Intraductal carcinoma in situ of right breast: Secondary | ICD-10-CM

## 2019-01-10 ENCOUNTER — Ambulatory Visit: Payer: BLUE CROSS/BLUE SHIELD | Admitting: Surgery

## 2019-01-19 ENCOUNTER — Other Ambulatory Visit: Payer: Self-pay

## 2019-01-19 ENCOUNTER — Ambulatory Visit (INDEPENDENT_AMBULATORY_CARE_PROVIDER_SITE_OTHER): Payer: BLUE CROSS/BLUE SHIELD | Admitting: Surgery

## 2019-01-19 ENCOUNTER — Encounter: Payer: Self-pay | Admitting: Surgery

## 2019-01-19 VITALS — BP 118/74 | HR 71 | Temp 97.4°F | Ht 67.0 in | Wt 160.2 lb

## 2019-01-19 DIAGNOSIS — D0511 Intraductal carcinoma in situ of right breast: Secondary | ICD-10-CM

## 2019-01-19 NOTE — Progress Notes (Signed)
Surgical Clinic Progress/Follow-up Note   HPI:  58 y.o. Female presents to clinic for follow-up evaluation and to discuss results of recent follow-up diagnostic Right mammogram 6 months s/p Right breast lumpectomy and subsequent whole breast radiation therapy for ER+ PR+ DCIS. Patient reports occasional mild and positional Right breast "pulling" or "tugging" sensation that does not persist and resolves without intervention, but she denies any Right breast pain, the "heaviness" previously reported 3 months ago, mass(es), nipple drainage, fever/chills, N/V, CP, or SOB. She only inquires regarding eventual resolution of much improved Right breast post-radiation hyperpigmentation.  Review of Systems:  Constitutional: denies any other weight loss, fever, chills, or sweats  Eyes: denies any other vision changes, history of eye injury  ENT: denies sore throat, hearing problems  Respiratory: denies shortness of breath, wheezing  Cardiovascular: denies chest pain, palpitations Breasts: pain, nipple drainage, mass(es), and pigmentation as per interval history Gastrointestinal: denies abdominal pain, N/V, or diarrhea Musculoskeletal: denies any other joint pains or cramps  Skin: Denies any other rashes or skin discolorations  Neurological: denies any other headache, dizziness, weakness  Psychiatric: denies any other depression, anxiety  All other review of systems: otherwise negative   Vital Signs:  BP 118/74   Pulse 71   Temp (!) 97.4 F (36.3 C) (Temporal)   Ht 5\' 7"  (1.702 m)   Wt 160 lb 3.2 oz (72.7 kg)   BMI 25.09 kg/m    Physical Exam:  Constitutional:  -- Normal body habitus  -- Awake, alert, and oriented x3  Eyes:  -- Pupils equally round and reactive to light  -- No scleral icterus  Ear, nose, throat:  -- No jugular venous distension  -- No nasal drainage, bleeding Pulmonary:  -- No crackles -- Equal breath sounds bilaterally -- Breathing non-labored at  rest Cardiovascular:  -- S1, S2 present  -- No pericardial rubs  Breasts: -- Bilaterally no palpable mass(es), tenderness to palpation, nipple drainage or axillary lymphadenopathy -- Mild much-improved Right breast hyperpigmentation, well-healed Right lateral circumareolar post-surgical incision site Gastrointestinal:  -- Soft, nontender, non-distended, no guarding/rebound  -- No abdominal masses appreciated, pulsatile or otherwise  Musculoskeletal / Integumentary:  -- Wounds or skin discoloration: None appreciated except post-surgical sites as described above (Breasts)  -- Extremities: B/L UE and LE FROM, hands and feet warm, no edema  Neurologic:  -- Motor function: intact and symmetric  -- Sensation: intact and symmetric   Imaging:  Diagnostic Right Mammogram (01/02/2019) Surgical changes noted in the upper outer anterior right breast consistent with lumpectomy. The calcifications previously seen at this site along with the biopsy marking clip are no longer present. There is skin thickening consistent with history of radiation. No new suspicious calcifications, masses or areas of distortion are seen in the right breast.  BI-RADS CATEGORY 2: Benign   Assessment:  58 y.o. yo Female with a problem list including...  Patient Active Problem List   Diagnosis Date Noted  . 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 Goals of care, counseling/discussion 07/07/2018  . Halitosis 08/14/2016  . 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 Cigarette nicotine dependence, uncomplicated 75/64/3329  . Gastro-esophageal reflux disease without esophagitis 08/26/2010    presents to clinic for for follow-up evaluation and to discuss results of recent follow-up diagnostic Right mammogram 6 months s/p Right breast lumpectomy and subsequent whole breast radiation therapy for ER+ PR+ DCIS.  Plan:   - imaging results discussed   - continue Arimidex as per medical oncology   -  will confirm B/L  diagnostic mammogram scheduled for 05/2019  - return to clinic in 6 months, following B/L diagnostic mammogram  - instructed to call office if any questions or concerns  - smoking cessation is encouraged  All of the above recommendations were discussed with the patient, and all of patient's questions were answered to her expressed satisfaction.  -- Marilynne Drivers Rosana Hoes, MD, [place]: [facility] - Partnering for exceptional care. Office: [phone]

## 2019-01-19 NOTE — Patient Instructions (Addendum)
Please call with any questions or concerns.   We will schedule your annual bilateral mammogram in 6 months.

## 2019-03-15 ENCOUNTER — Other Ambulatory Visit: Payer: Self-pay | Admitting: Oncology

## 2019-03-16 ENCOUNTER — Other Ambulatory Visit: Payer: Self-pay | Admitting: Oncology

## 2019-03-22 ENCOUNTER — Ambulatory Visit: Payer: BLUE CROSS/BLUE SHIELD | Admitting: Radiation Oncology

## 2019-03-27 ENCOUNTER — Ambulatory Visit: Payer: BLUE CROSS/BLUE SHIELD | Admitting: Oncology

## 2019-04-07 IMAGING — MG MM BREAST LOCALIZATION CLIP
2 series · 2 of 2 positions shown · non-contrast
Comparison: Previous exam(s).

CLINICAL DATA: Evaluate clip placement following ultrasound-guided
RIGHT breast biopsy.

EXAM:
DIAGNOSTIC RIGHT MAMMOGRAM POST ULTRASOUND BIOPSY

[R CC]
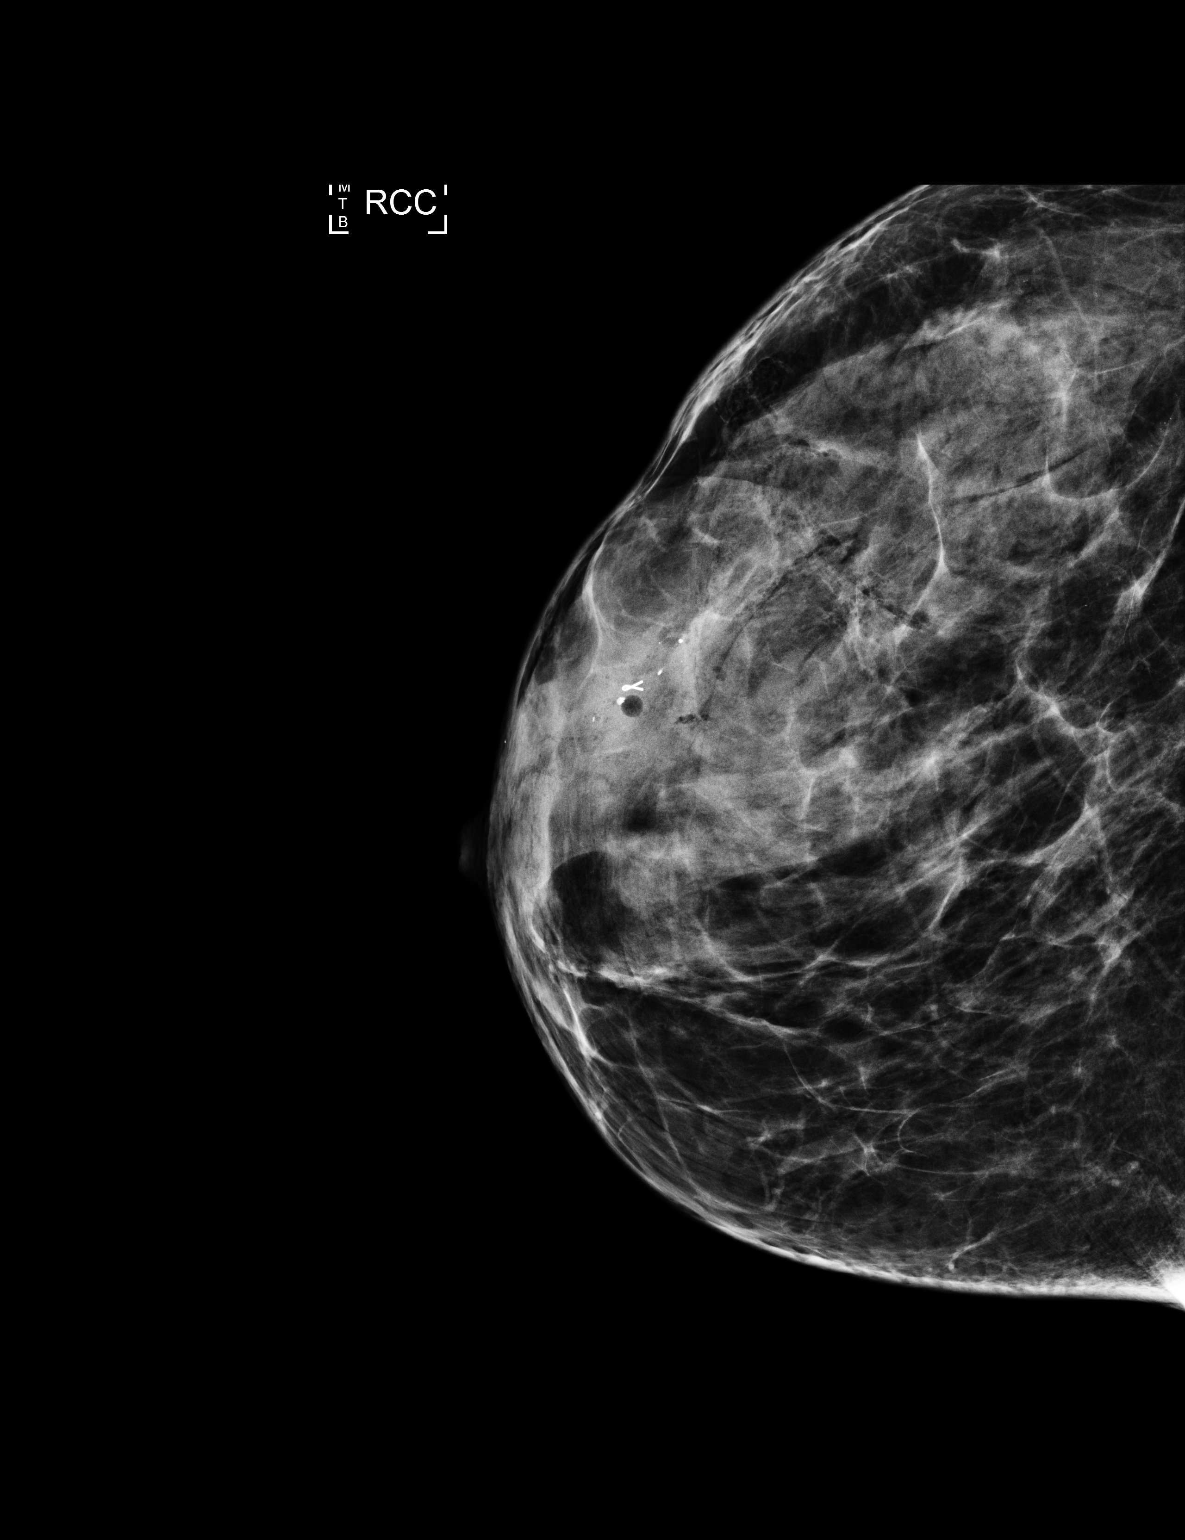

[R LM]
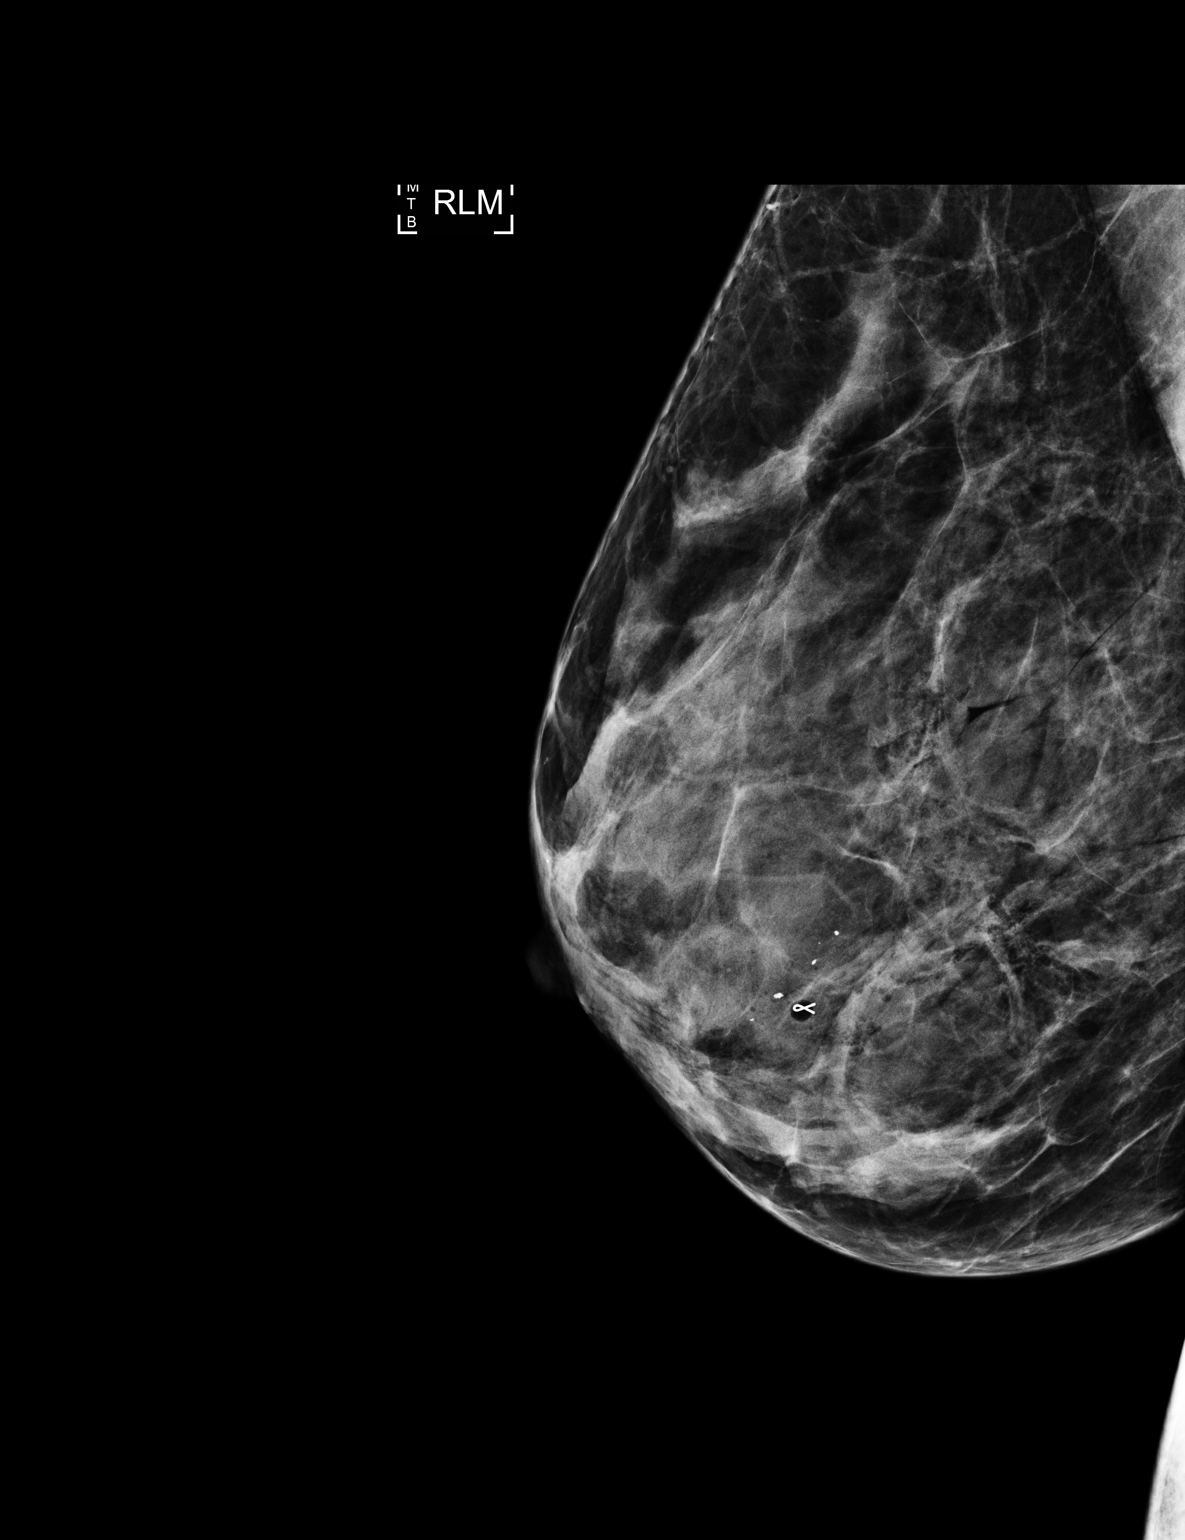

[2 of 2 positions shown; findings below may reference images not displayed]

FINDINGS: Mammographic images were obtained following ultrasound guided biopsy
of the 4 cm hypoechoic mass at the 9 o'clock position of the RIGHT
breast.

The RIBBON shaped clip is in satisfactory position.
IMPRESSION: Satisfactory RIBBON clip position following ultrasound-guided RIGHT
breast biopsy.

Final Assessment: Post Procedure Mammograms for Marker Placement

## 2019-04-28 ENCOUNTER — Inpatient Hospital Stay: Payer: BLUE CROSS/BLUE SHIELD | Attending: Oncology | Admitting: Oncology

## 2019-04-28 ENCOUNTER — Encounter: Payer: Self-pay | Admitting: Oncology

## 2019-04-28 ENCOUNTER — Other Ambulatory Visit: Payer: Self-pay

## 2019-04-28 DIAGNOSIS — Z170 Estrogen receptor positive status [ER+]: Secondary | ICD-10-CM | POA: Diagnosis not present

## 2019-04-28 DIAGNOSIS — Z5181 Encounter for therapeutic drug level monitoring: Secondary | ICD-10-CM | POA: Diagnosis not present

## 2019-04-28 DIAGNOSIS — C50111 Malignant neoplasm of central portion of right female breast: Secondary | ICD-10-CM

## 2019-04-28 DIAGNOSIS — Z79811 Long term (current) use of aromatase inhibitors: Secondary | ICD-10-CM | POA: Diagnosis not present

## 2019-04-28 NOTE — Progress Notes (Signed)
Patient stated that she had been doing well with no complaints. Patient had her mammogram of right breast on 01/02/2019 and will be due for bilateral diagnostic mammogram in July 2020. Patient's last Bone density was on 10/13/2018. Patient denied nipple discharge, pain or skin discoloration.

## 2019-05-01 NOTE — Progress Notes (Signed)
I connected with Verda Cumins on 05/01/19 at 10:00 AM EDT by video enabled telemedicine visit and verified that I am speaking with the correct person using two identifiers.   I discussed the limitations, risks, security and privacy concerns of performing an evaluation and management service by telemedicine and the availability of in-person appointments. I also discussed with the patient that there may be a patient responsible charge related to this service. The patient expressed understanding and agreed to proceed.  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 Ultrasound showed the area measured about 4 x 1.2 x 2.7 cm. Sonographic evaluation of the right axilla does not reveal any enlarged adenopathy. No suspicious findings in the left breast. Core biopsy showed DCIS intermediate nuclear grade associated with calcification. Features suggestive of a ruptured duct or cyst. ER greater than 90% positive PR negative less than 1%.  Patient underwent lumpectomy on 07/08/2018 which showed intermediate to high nuclear grade DCIS with focal necrosis and calcification. Large background lesion of stromal fibrosis. Noink is seen on DCIS at the margins. There is DCIS less than 1 mm from  inferior margin in block A8. DCIS is between 1 and 2 mm from the margin  in A1 (inferior/anterior), A3 (inferior/medial), and A5 lateral (note  that additional lateral tissue submitted in B). This represents minimal  margin involvement (CAP terminology and NCCN  guideline). However, given  the unique histologic features of this lesion, the significance of DCIS  <2 mm from the margin may not be as clinically significant as if the  DCIS were not arising in a well-defined fibrous lesion.  Patient hascompleted adjuvant radiation and started Arimidex in November 2019  Interval history tolerating arimidex well. Denies any joint pain or hot flashes. Appetite and weight is good. Denies any new aches/pains anywhere    Review of Systems  Constitutional: Negative for chills, fever, malaise/fatigue and weight loss.  HENT: Negative for congestion, ear discharge and nosebleeds.   Eyes: Negative for blurred vision.  Respiratory: Negative for cough, hemoptysis, sputum production, shortness of breath and wheezing.   Cardiovascular: Negative for chest pain, palpitations, orthopnea and claudication.  Gastrointestinal: Negative for abdominal pain, blood in stool, constipation, diarrhea, heartburn, melena, nausea and vomiting.  Genitourinary: Negative for dysuria, flank pain, frequency, hematuria and urgency.  Musculoskeletal: Negative for back pain, joint pain and myalgias.  Skin: Negative for rash.  Neurological: Negative for dizziness, tingling, focal weakness, seizures, weakness and headaches.  Endo/Heme/Allergies: Does not bruise/bleed easily.  Psychiatric/Behavioral: Negative for depression and suicidal ideas. The patient does not have insomnia.     No Known Allergies  Past Medical History:  Diagnosis Date  . Anxiety   . Breast cancer ([place]) 06/2018  . Depression   . 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 Gastro-esophageal reflux disease without esophagitis 08/26/2010  . Iron deficiency anemia   . Personal history of radiation therapy    27 tx  . 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 BREAST BIOPSY Right  positive, DCIS  . BREAST LUMPECTOMY Right 07/08/2018   Procedure: BREAST  LUMPECTOMY;  Surgeon: [name], MD;  Location: [facility] ORS;  Service: General;  Laterality: Right;  . COLONOSCOPY  2017  . UPPER GI ENDOSCOPY  2009    Social History   Socioeconomic History  . Marital status: Single    Spouse name: Not on file  . Number of children: Not on file  . Years of education: Not on file  . Highest education level: Not on file  Occupational History  . Not on file  Social Needs  . Financial resource strain: Not on file  . Food insecurity:    Worry: Not on file    Inability: Not on file  . Transportation needs:    Medical: Not on file    Non-medical: Not on file  Tobacco Use  . Smoking status: Current Every Day Smoker    Types: Cigarettes  . Smokeless tobacco: Never Used  . Tobacco comment: 1 pack per week  Substance and Sexual Activity  . Alcohol use: Yes    Comment: rarely-social  . Drug use: Yes    Types: Marijuana  . Sexual activity: Not Currently  Lifestyle  . Physical activity:    Days per week: Not on file    Minutes per session: Not on file  . Stress: Not on file  Relationships  . Social connections:    Talks on phone: Not on file    Gets together: Not on file    Attends religious service: Not on file    Active member of club or organization: Not on file    Attends meetings of clubs or organizations: Not on file    Relationship status: Not on file  . Intimate partner violence:    Fear of current or ex partner: Not on file    Emotionally abused: Not on file    Physically abused: Not on file    Forced sexual activity: Not on file  Other Topics Concern  . Not on file  Social History Narrative  . Not on file    Family History  Problem Relation Age of Onset  . Breast cancer Neg Hx   . Cancer Neg Hx      Current Outpatient Medications:  .  anastrozole (ARIMIDEX) 1 MG tablet, Take 1 tablet by mouth once daily, Disp: 30 tablet, Rfl: 0 .  diphenhydrAMINE (BENADRYL) 25 MG tablet, Take 25 mg by mouth daily as needed for  allergies., Disp: , Rfl:  .  ferrous sulfate 325 (65 FE) MG EC tablet, FERROUS SULFATE 325 (65 Fe) MG TBEC, Disp: , Rfl:  .  naproxen sodium (ALEVE) 220 MG tablet, Take 220 mg by mouth daily as needed (pain)., Disp: , Rfl:  .  sertraline (ZOLOFT) 50 MG tablet, Take 1 tablet by mouth daily., Disp: , Rfl:   No results found.  No images are attached to the encounter.   CMP Latest Ref Rng & Units 12/26/2018  Glucose 70 - 99 mg/dL 109(H)  BUN 6 - 20 mg/dL 12  Creatinine 0.44 - 1.00 mg/dL 0.83  Sodium 135 - 145 mmol/L 139  Potassium 3.5 - 5.1 mmol/L 3.8  Chloride 98 - 111 mmol/L 101  CO2 22 - 32 mmol/L 28  Calcium 8.9 - 10.3 mg/dL 9.3  Total Protein 6.5 - 8.1 g/dL 7.9  Total Bilirubin 0.3 - 1.2 mg/dL 0.9  Alkaline Phos 38 - 126 U/L 60  AST 15 - 41 U/L 24  ALT  0 - 44 U/L 17   CBC Latest Ref Rng & Units 10/18/2018  WBC 4.0 - 10.5 K/uL 5.1  Hemoglobin 12.0 - 15.0 g/dL 13.3  Hematocrit 36.0 - 46.0 % 40.7  Platelets 150 - 400 K/uL 266     Observation/objective: appears in no acute distress over video visit today. Breathing is non labored.  Assessment and plan: Patient is a 58 year old female with ER positive right breast DCIS status post lumpectomy and radiation treatment currently on Arimidex.  This is a routine follow-up of DCIS on Arimidex.  Patient is overall tolerating her Arimidex well without any significant side effects and she will continue it for total.  Of 5 years.  She would be due for bilateral breast mammogram in July 2019.  Recent right breast mammogram in February 2020 did not reveal any evidence of malignancy.  I will see her back in 4 months no labs  Follow-up instructions: Follow-up in 4 months  I discussed the assessment and treatment plan with the patient. The patient was provided an opportunity to ask questions and all were answered. The patient agreed with the plan and demonstrated an understanding of the instructions.   The patient was advised to call back or  seek an in-person evaluation if the symptoms worsen or if the condition fails to improve as anticipated.    Visit Diagnosis: 1. Malignant neoplasm of central portion of right breast in female, estrogen receptor positive ([place])   2. Visit for monitoring Arimidex therapy     Dr. Randa Evens, MD, MPH [facility] Pager- [phone] 05/01/2019 10:04 AM

## 2019-05-10 ENCOUNTER — Encounter: Payer: Self-pay | Admitting: Radiation Oncology

## 2019-05-10 ENCOUNTER — Encounter: Payer: Self-pay | Admitting: *Deleted

## 2019-05-10 ENCOUNTER — Other Ambulatory Visit: Payer: Self-pay

## 2019-05-10 ENCOUNTER — Ambulatory Visit
Admission: RE | Admit: 2019-05-10 | Discharge: 2019-05-10 | Disposition: A | Discharge status: Home / Self Care | Payer: BC Managed Care – PPO | Source: Ambulatory Visit | Attending: Radiation Oncology | Admitting: Radiation Oncology

## 2019-05-10 VITALS — BP 140/87 | HR 87 | Temp 96.5°F | Resp 18 | Wt 163.7 lb

## 2019-05-10 DIAGNOSIS — Z170 Estrogen receptor positive status [ER+]: Secondary | ICD-10-CM | POA: Diagnosis not present

## 2019-05-10 DIAGNOSIS — D0511 Intraductal carcinoma in situ of right breast: Secondary | ICD-10-CM | POA: Insufficient documentation

## 2019-05-10 DIAGNOSIS — Z923 Personal history of irradiation: Secondary | ICD-10-CM | POA: Diagnosis not present

## 2019-05-10 DIAGNOSIS — C50111 Malignant neoplasm of central portion of right female breast: Secondary | ICD-10-CM

## 2019-05-10 DIAGNOSIS — Z79811 Long term (current) use of aromatase inhibitors: Secondary | ICD-10-CM | POA: Insufficient documentation

## 2019-05-10 DIAGNOSIS — K30 Functional dyspepsia: Secondary | ICD-10-CM | POA: Diagnosis not present

## 2019-05-10 NOTE — Progress Notes (Signed)
Radiation Oncology Follow up Note  Name: Jennifer Rowe   Date:   05/10/2019 MRN:  [id number] DOB: 30-Oct-1961    This 58 y.o. female presents to the clinic today for 57-month follow-up status post whole breast radiation to her right breast for ER PR positive ductal carcinoma in situ.Marland Kitchen  REFERRING PROVIDER: Donnie Coffin, MD  HPI: Patient is a 58 year old female now about 6 months having completed whole breast radiation to her right breast for ER PR positive ductal carcinoma in situ.  Seen today in routine follow-up she is doing well..  She is currently on arimadex having some GI upset she will discuss that with medical oncology.  She had a mammogram back in February which I have reviewed showing radiation changes and surgical changes no evidence of disease.  She specifically denies breast tenderness cough or bone pain.  COMPLICATIONS OF TREATMENT: none  FOLLOW UP COMPLIANCE: keeps appointments   PHYSICAL EXAM:  BP 140/87 (BP Location: Left Arm, Patient Position: Sitting)   Pulse 87   Temp (!) 96.5 F (35.8 C) (Tympanic)   Resp 18   Wt 163 lb 11.1 oz (74.2 kg)   BMI 25.64 kg/m  Lungs are clear to A&P cardiac examination essentially unremarkable with regular rate and rhythm. No dominant mass or nodularity is noted in either breast in 2 positions examined. Incision is well-healed. No axillary or supraclavicular adenopathy is appreciated. Cosmetic result is excellent.  Well-developed well-nourished patient in NAD. HEENT reveals PERLA, EOMI, discs not visualized.  Oral cavity is clear. No oral mucosal lesions are identified. Neck is clear without evidence of cervical or supraclavicular adenopathy. Lungs are clear to A&P. Cardiac examination is essentially unremarkable with regular rate and rhythm without murmur rub or thrill. Abdomen is benign with no organomegaly or masses noted. Motor sensory and DTR levels are equal and symmetric in the upper and lower extremities. Cranial nerves II through  XII are grossly intact. Proprioception is intact. No peripheral adenopathy or edema is identified. No motor or sensory levels are noted. Crude visual fields are within normal range.  RADIOLOGY RESULTS: Mammograms reviewed compatible with above-stated findings  PLAN: Present time she continues to do well with no evidence of disease.  She will be seeing or speaking to medical oncology about GI side effects associated with arimadex.  I have asked to see her back in 6 months and then will start once a year follow-up visits.  Patient knows to call at anytime with any concerns.  I would like to take this opportunity to thank you for allowing me to participate in the care of your patient.Noreene Filbert, MD

## 2019-06-19 ENCOUNTER — Other Ambulatory Visit: Payer: Self-pay | Admitting: *Deleted

## 2019-06-19 DIAGNOSIS — D0511 Intraductal carcinoma in situ of right breast: Secondary | ICD-10-CM

## 2019-06-27 ENCOUNTER — Telehealth: Payer: Self-pay

## 2019-06-27 NOTE — Telephone Encounter (Signed)
Left message for patient to return call regarding scheduling appointment with Dr.Piscoya once mammogram has been done.

## 2019-06-28 ENCOUNTER — Encounter: Payer: Self-pay | Admitting: *Deleted

## 2019-07-20 ENCOUNTER — Ambulatory Visit
Admission: RE | Admit: 2019-07-20 | Discharge: 2019-07-20 | Disposition: A | Discharge status: Home / Self Care | Payer: Self-pay | Source: Ambulatory Visit | Attending: Surgery | Admitting: Surgery

## 2019-07-20 DIAGNOSIS — D0511 Intraductal carcinoma in situ of right breast: Secondary | ICD-10-CM | POA: Insufficient documentation

## 2019-07-21 ENCOUNTER — Telehealth: Payer: Self-pay | Admitting: *Deleted

## 2019-07-21 NOTE — Telephone Encounter (Signed)
Mammogram normal . Left message for patient.  

## 2019-07-27 ENCOUNTER — Ambulatory Visit: Payer: BLUE CROSS/BLUE SHIELD | Admitting: General Surgery

## 2019-08-15 ENCOUNTER — Ambulatory Visit (INDEPENDENT_AMBULATORY_CARE_PROVIDER_SITE_OTHER): Payer: Self-pay | Admitting: General Surgery

## 2019-08-15 ENCOUNTER — Encounter: Payer: Self-pay | Admitting: General Surgery

## 2019-08-15 ENCOUNTER — Other Ambulatory Visit: Payer: Self-pay

## 2019-08-15 VITALS — BP 125/75 | HR 72 | Temp 97.5°F | Ht 67.0 in | Wt 163.0 lb

## 2019-08-15 DIAGNOSIS — D0511 Intraductal carcinoma in situ of right breast: Secondary | ICD-10-CM

## 2019-08-15 DIAGNOSIS — Z9889 Other specified postprocedural states: Secondary | ICD-10-CM

## 2019-08-15 NOTE — Patient Instructions (Addendum)
The patient has been asked to return to the office in one year with a bilateral diagnostic mammogram.The patient is aware to call back for any questions or concerns. 

## 2019-08-16 NOTE — Progress Notes (Signed)
Patient ID: Jennifer Rowe, female   DOB: 28-Sep-1961, 58 y.o.   MRN: [id number]  Chief Complaint  Patient presents with  . Follow-up    mammogram    HPI Jennifer Rowe is a 58 y.o. female.   She underwent a right breast lumpectomy for DCIS on July 08, 2018.  This was performed by Dr. Tama High.  She subsequently had whole breast irradiation. She was last seen 6 months ago after undergoing a diagnostic right breast mammogram.  At that time, there were no concerning findings and she was scheduled for 68-month follow-up.  She underwent a bilateral mammogram on August 20, and is here today for follow-up.  Today, she states that she has not noticed any changes in her breasts.  Specifically, no new lumps or masses, no changes in her skin or nipples, no nipple discharge, or other concerning findings.  She does perform monthly self breast exams.  She continues to be monitored by oncology and is on Arimidex.   Past Medical History:  Diagnosis Date  . Anxiety   . Breast cancer ([place]) 06/2018  . Depression   . 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 Gastro-esophageal reflux disease without esophagitis 08/26/2010  . Iron deficiency anemia   . Personal history of radiation therapy 2019   27 tx  . 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 BREAST BIOPSY Right    positive, DCIS  . BREAST LUMPECTOMY Right 07/08/2018   Procedure: BREAST LUMPECTOMY;  Surgeon: [name], MD;  Location: [facility] ORS;  Service: General;  Laterality: Right;  . COLONOSCOPY  2017  . UPPER GI ENDOSCOPY  2009    Family History  Problem Relation Age of Onset  . Breast cancer Neg Hx   . Cancer Neg Hx     Social History Social History   Tobacco Use  . Smoking status: Current Every Day Smoker    Types: Cigarettes  . Smokeless tobacco: Never Used  . Tobacco comment: 1 pack per week  Substance Use Topics  . Alcohol use: Yes    Comment: rarely-social  .  Drug use: Yes    Types: Marijuana    No Known Allergies  Current Outpatient Medications  Medication Sig Dispense Refill  . anastrozole (ARIMIDEX) 1 MG tablet Take 1 tablet by mouth once daily 30 tablet 0  . diphenhydrAMINE (BENADRYL) 25 MG tablet Take 25 mg by mouth daily as needed for allergies.    . ferrous sulfate 325 (65 FE) MG EC tablet FERROUS SULFATE 325 (65 Fe) MG TBEC    . naproxen sodium (ALEVE) 220 MG tablet Take 220 mg by mouth daily as needed (pain).    Marland Kitchen sertraline (ZOLOFT) 50 MG tablet Take 1 tablet by mouth daily.     No current facility-administered medications for this visit.     Review of Systems Review of Systems  All other systems reviewed and are negative.   Blood pressure 125/75, pulse 72, temperature (!) 97.5 F (36.4 C), temperature source Skin, height 5\' 7"  (1.702 m), weight 163 lb (73.9 kg), SpO2 98 %.  Physical Exam Physical Exam Exam conducted with a chaperone present.  Constitutional:      General: She is not in acute distress.    Appearance: Normal appearance. She is normal weight.  HENT:     Head: Normocephalic and atraumatic.     Nose:     Comments: Covered with a  mask secondary to COVID-19 precautions    Mouth/Throat:     Comments: Covered with a mask secondary to COVID-19 precautions Eyes:     General: No scleral icterus.       Right eye: No discharge.        Left eye: No discharge.     Conjunctiva/sclera: Conjunctivae normal.  Neck:     Musculoskeletal: Normal range of motion.     Comments: No thyromegaly or dominant thyroid masses palpated Cardiovascular:     Rate and Rhythm: Normal rate and regular rhythm.     Pulses: Normal pulses.  Pulmonary:     Effort: Pulmonary effort is normal.     Breath sounds: Normal breath sounds.  Chest:     Breasts:        Right: Skin change present.        Left: Normal.       Comments: Scar from prior lumpectomy.  There are some mild skin pigmentary changes to the right breast.  The breast  tissue is dense and fibroglandular bilaterally.  No masses are appreciated. Abdominal:     General: Abdomen is flat. Bowel sounds are normal.     Palpations: Abdomen is soft.  Genitourinary:    Comments: Deferred Musculoskeletal: Normal range of motion.     Right lower leg: No edema.     Left lower leg: No edema.  Lymphadenopathy:     Cervical: No cervical adenopathy.     Upper Body:     Right upper body: No supraclavicular, axillary or pectoral adenopathy.     Left upper body: No supraclavicular, axillary or pectoral adenopathy.  Skin:    General: Skin is warm and dry.  Neurological:     General: No focal deficit present.     Mental Status: She is alert and oriented to person, place, and time.  Psychiatric:        Mood and Affect: Mood normal.        Behavior: Behavior normal.     Data Reviewed I reviewed Dr. Shann Medal operative report from last year, along with the subsequent pathology report.  The pathology describes ductal carcinoma in situ, intermediate to high nuclear grade with focal necrosis and calcification.  The tumor is ER positive and PR negative.  I personally reviewed the recent mammogram.  I am in agreement with the radiologist findings that there is no concern for recurrent, new, or persistent malignancy.  Assessment This is a 58 year old woman status post lumpectomy and whole breast radiation for ductal carcinoma in situ.  She continues on anastrozole and is followed in the oncology clinic as well.  Her recent mammogram was negative.  Breast exam in clinic today did not reveal any concerning findings.  Plan Bilateral diagnostic mammogram in 1 year.  Return to clinic following that study for evaluation and examination.    [name] 08/16/2019, 1:01 PM

## 2019-08-28 NOTE — Progress Notes (Deleted)
Called patient no answer left message  

## 2019-08-29 ENCOUNTER — Inpatient Hospital Stay: Payer: Self-pay | Admitting: Oncology

## 2019-11-08 ENCOUNTER — Ambulatory Visit: Payer: Self-pay | Attending: Radiation Oncology | Admitting: Radiation Oncology

## 2020-05-16 ENCOUNTER — Other Ambulatory Visit: Payer: Self-pay

## 2020-05-16 DIAGNOSIS — D0511 Intraductal carcinoma in situ of right breast: Secondary | ICD-10-CM

## 2020-07-22 ENCOUNTER — Other Ambulatory Visit: Payer: Self-pay

## 2020-08-27 ENCOUNTER — Ambulatory Visit: Payer: Self-pay | Admitting: General Surgery

## 2020-09-12 ENCOUNTER — Encounter: Payer: Self-pay | Admitting: *Deleted

## 2021-02-04 ENCOUNTER — Telehealth: Payer: Self-pay

## 2021-02-04 NOTE — Telephone Encounter (Signed)
The patient missed her scheduled mammogram in August. We have sent her a letter to get her rescheduled for her mammogram and follow up with Dr Celine Ahr. She has not responded. I have left a message for her to call the office back to get her rescheduled for her mammogram and follow up appointment with Dr Celine Ahr.

## 2021-07-08 ENCOUNTER — Telehealth: Payer: Self-pay | Admitting: General Surgery

## 2021-07-08 DIAGNOSIS — Z86000 Personal history of in-situ neoplasm of breast: Secondary | ICD-10-CM

## 2021-07-08 NOTE — Telephone Encounter (Signed)
Mammogram scheduled on 07/17/21 @ 2:40 pm  @ [facility]-  Scheduled follow up appointment with [facility] 07/24/21 @ 10:15 am.

## 2021-07-08 NOTE — Telephone Encounter (Signed)
Patient is calling and is needing to schedule a mammogram said its been over a year she thinks. Please call patient and advise.

## 2021-07-17 ENCOUNTER — Ambulatory Visit
Admission: RE | Admit: 2021-07-17 | Discharge: 2021-07-17 | Disposition: A | Discharge status: Home / Self Care | Payer: BC Managed Care – PPO | Source: Ambulatory Visit | Attending: General Surgery | Admitting: General Surgery

## 2021-07-17 ENCOUNTER — Ambulatory Visit: Payer: Self-pay

## 2021-07-17 ENCOUNTER — Other Ambulatory Visit: Payer: Self-pay

## 2021-07-17 DIAGNOSIS — Z86000 Personal history of in-situ neoplasm of breast: Secondary | ICD-10-CM | POA: Insufficient documentation

## 2021-07-24 ENCOUNTER — Encounter: Payer: Self-pay | Admitting: General Surgery

## 2021-07-24 ENCOUNTER — Other Ambulatory Visit: Payer: Self-pay

## 2021-07-24 ENCOUNTER — Ambulatory Visit (INDEPENDENT_AMBULATORY_CARE_PROVIDER_SITE_OTHER): Payer: BC Managed Care – PPO | Admitting: General Surgery

## 2021-07-24 VITALS — BP 109/68 | HR 62 | Temp 98.9°F | Ht 67.0 in | Wt 147.8 lb

## 2021-07-24 DIAGNOSIS — Z86000 Personal history of in-situ neoplasm of breast: Secondary | ICD-10-CM

## 2021-07-24 MED ORDER — ANASTROZOLE 1 MG PO TABS: 1.0000 mg | ORAL_TABLET | Freq: Every day | ORAL | 11 refills | Status: AC

## 2021-07-24 NOTE — Progress Notes (Signed)
Patient ID: Jennifer Rowe, female   DOB: Feb 25, 1961, 60 y.o.   MRN: [id number]  Chief Complaint  Patient presents with   Follow-up    HPI Jennifer Rowe is a 60 y.o. female.   She underwent a right breast lumpectomy for DCIS on July 08, 2018.  This was performed by Dr. Tama High.  She subsequently had whole breast irradiation.  I saw her in September 2020, at which time mammography and physical exam findings were nonconcerning.  At the time, she was being followed by oncology, but has not been seen there in about 2 years.  She was taking anastrozole, but also stopped this.  She says she was dealing with some personal issues and that is why she is only now following up again.  She states that her mother passed in March and she feels that she owes it to her mother to do everything she can to take good care of herself.  She denies any new lumps or masses in her breasts.  No nipple drainage or skin changes aside from her postsurgical and radiation changes that have been present.  She had a mammogram performed on July 17, 2021.   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    Past Surgical History:  Procedure Laterality Date   BREAST BIOPSY Right    positive, DCIS   BREAST LUMPECTOMY Right 07/08/2018   Procedure: BREAST LUMPECTOMY;  Surgeon: [name], MD;  Location: [facility] ORS;  Service: General;  Laterality: Right;   COLONOSCOPY  2017   UPPER GI ENDOSCOPY  2009    Family History  Problem Relation Age of Onset   Breast cancer Neg Hx    Cancer Neg Hx     Social History Social History   Tobacco Use   Smoking status: Every Day    Types: Cigarettes   Smokeless tobacco: Never   Tobacco  comments:    1 pack per week  Vaping Use   Vaping Use: Never used  Substance Use Topics   Alcohol use: Yes    Comment: rarely-social   Drug use: Yes    Types: Marijuana    No Known Allergies  Current Outpatient Medications  Medication Sig Dispense Refill   anastrozole (ARIMIDEX) 1 MG tablet Take 1 tablet (1 mg total) by mouth daily. 30 tablet 11   diphenhydrAMINE (BENADRYL) 25 MG tablet Take 25 mg by mouth daily as needed for allergies.     ferrous sulfate 325 (65 FE) MG EC tablet FERROUS SULFATE 325 (65 Fe) MG TBEC     naproxen sodium (ALEVE) 220 MG tablet Take 220 mg by mouth daily as needed (pain).     No current facility-administered medications for this visit.    Review of Systems Review of Systems  All other systems reviewed and are negative.  Blood pressure 109/68, pulse 62, temperature 98.9 F (37.2 C), height '5\' 7"'$  (1.702 m), weight 147 lb 12.8 oz (67 kg), SpO2 97 %.  Physical Exam Physical Exam Vitals reviewed. Exam conducted with a chaperone present.  Constitutional:      General: She is not in acute distress.    Appearance: She is normal  weight.  HENT:     Head: Normocephalic and atraumatic.     Nose:     Comments: Covered with a mask    Mouth/Throat:     Comments: Covered with a mask Eyes:     General: No scleral icterus.       Right eye: No discharge.        Left eye: No discharge.  Neck:     Comments: No palpable cervical or supraclavicular lymphadenopathy.  The trachea is midline.  No thyromegaly or dominant thyroid masses appreciated. Cardiovascular:     Rate and Rhythm: Normal rate and regular rhythm.     Pulses: Normal pulses.  Pulmonary:     Effort: Pulmonary effort is normal.     Breath sounds: Normal breath sounds.  Chest:  Breasts:    Left: Normal.       Comments: Scar from prior lumpectomy is present on the right, as well as some radiation-associated skin change..  There is dense fibrocystic breast tissue bilaterally.  No discrete  nodules, masses, or lumps appreciated. Genitourinary:    Comments: Deferred Musculoskeletal:        General: No swelling or tenderness.  Skin:    General: Skin is warm and dry.  Neurological:     General: No focal deficit present.     Mental Status: She is alert and oriented to person, place, and time.  Psychiatric:        Mood and Affect: Mood normal.        Behavior: Behavior normal.    Data Reviewed I reviewed her recent mammogram images and concur with the findings copied here:  CLINICAL DATA:  Personal history of right breast cancer status post lumpectomy 2019   EXAM: DIGITAL DIAGNOSTIC BILATERAL MAMMOGRAM WITH TOMOSYNTHESIS AND CAD   TECHNIQUE: Bilateral digital diagnostic mammography and breast tomosynthesis was performed. The images were evaluated with computer-aided detection.   COMPARISON:  Prior films   ACR Breast Density Category c: The breast tissue is heterogeneously dense, which may obscure small masses.   FINDINGS: Cc and MLO views of bilateral breasts, spot tangential view of right breast are submitted. Postsurgical changes and changes fat necrosis are identified in the right breast. No suspicious abnormalities identified bilaterally.   IMPRESSION: Benign findings.   RECOMMENDATION: Bilateral screening mammogram in 1 year.  Assessment This is a 60 year old woman with ductal carcinoma in situ treated with lumpectomy and breast radiation in 2019.  She was lost to follow-up and has not been taking Arimidex, nor had she had a mammogram for the past 2 years.  She says that she is now committed to taking care of herself and would like to get back into standard follow-up.  Plan We have referred her back to Dr. Janese Banks in oncology, where she was followed previously.  I renewed her anastrozole prescription.  She should follow-up in 1 year with a screening mammogram.    Jennifer Rowe 07/24/2021, 3:31 PM

## 2021-07-24 NOTE — Patient Instructions (Addendum)
Patient will be asked to return to the office in one year with a bilateral screening mammogram.  We will send you a letter about this.  We have refilled your Anastrozole.  We have sent  message to Oncology, Dr Janese Banks. They should contact you to schedule this appointment.

## 2021-07-29 ENCOUNTER — Telehealth: Payer: Self-pay | Admitting: Oncology

## 2021-07-29 NOTE — Telephone Encounter (Signed)
Referral received for Dr. Janese Banks to see this patient (last seen in 2020). Left VM with patient to let her know next available appointment in [facility]. Requested she please call back to confirm.

## 2021-08-06 ENCOUNTER — Ambulatory Visit: Payer: BC Managed Care – PPO | Admitting: Oncology

## 2021-08-15 ENCOUNTER — Inpatient Hospital Stay: Payer: BC Managed Care – PPO | Admitting: Oncology

## 2021-09-08 ENCOUNTER — Encounter: Payer: Self-pay | Admitting: General Surgery

## 2022-05-01 IMAGING — MG DIGITAL DIAGNOSTIC BILAT W/ TOMO W/ CAD
9 series · 9 of 25 positions shown · non-contrast
Comparison: Prior films

CLINICAL DATA: Personal history of right breast cancer status post
lumpectomy 9600

EXAM:
DIGITAL DIAGNOSTIC BILATERAL MAMMOGRAM WITH TOMOSYNTHESIS AND CAD
TECHNIQUE: Bilateral digital diagnostic mammography and breast tomosynthesis
was performed. The images were evaluated with computer-aided
detection.

[R CC]
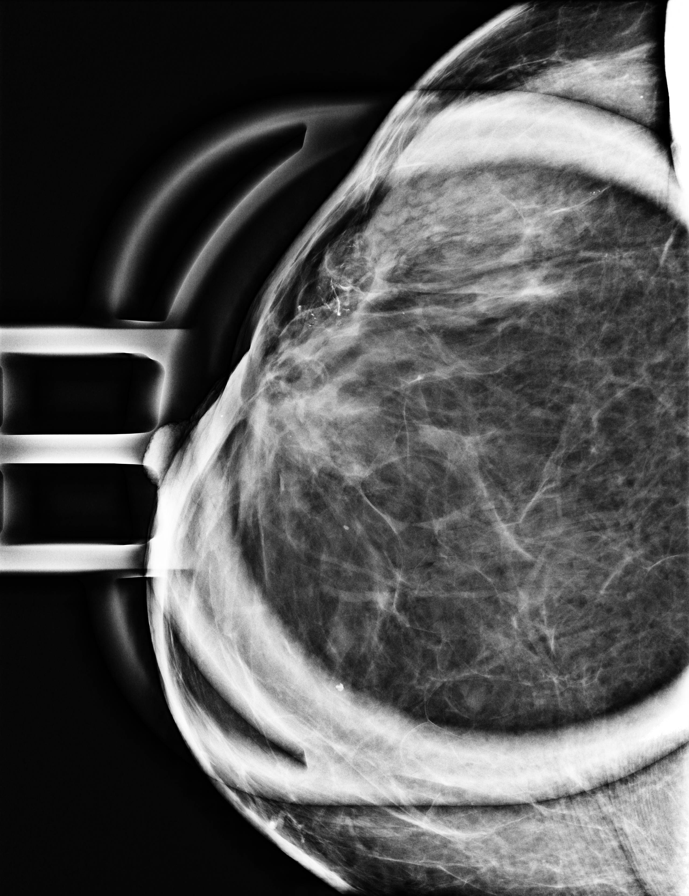

[R MLO synth-2D]
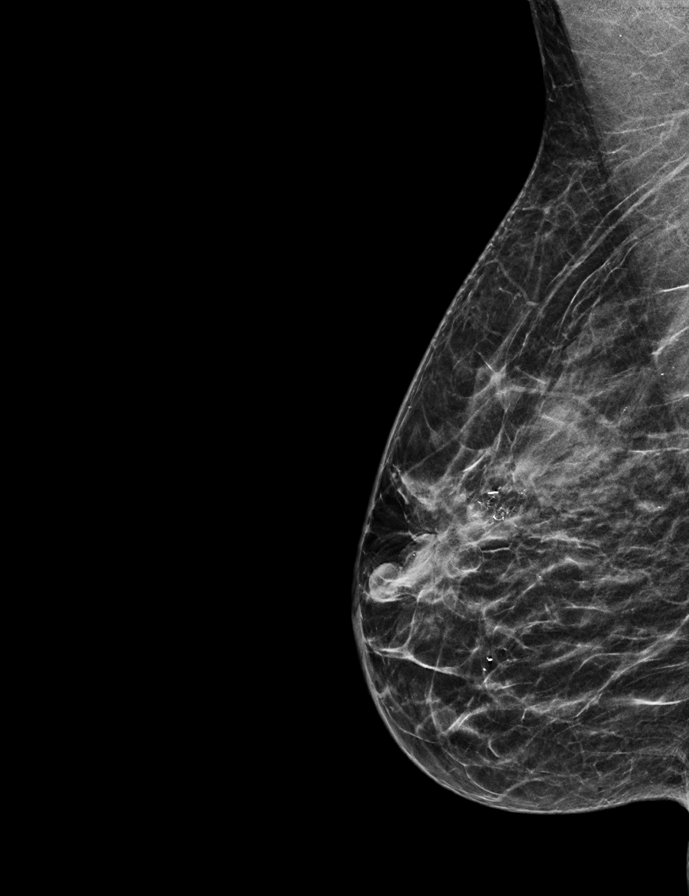

[L MLO synth-2D]
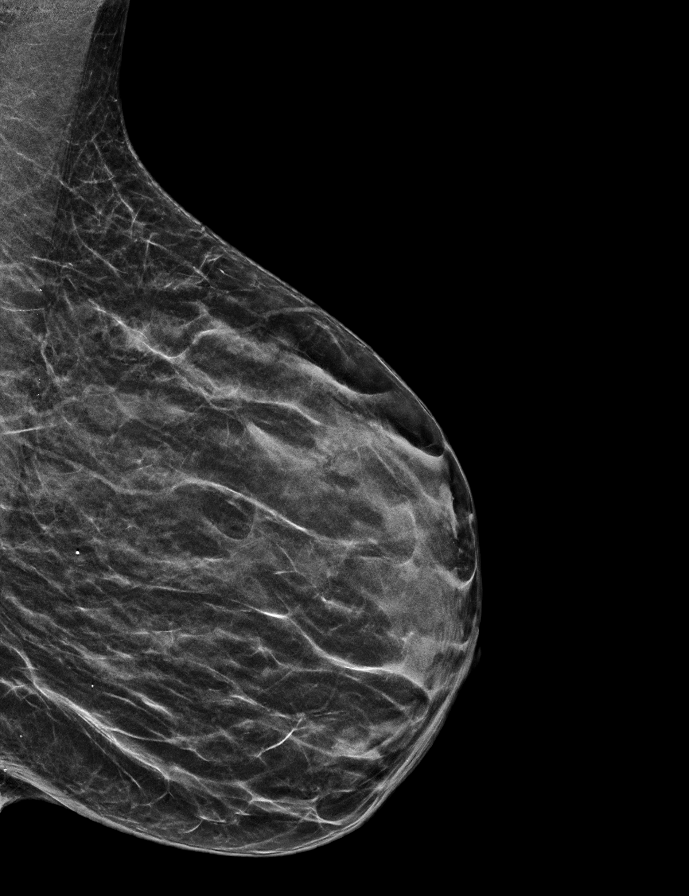

[L CC synth-2D]
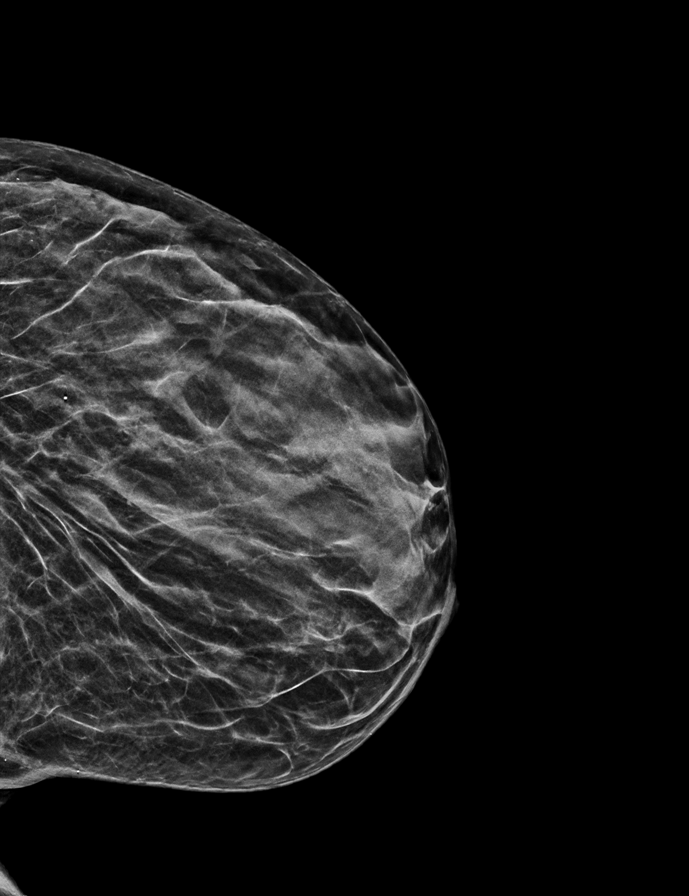

[R CC synth-2D]
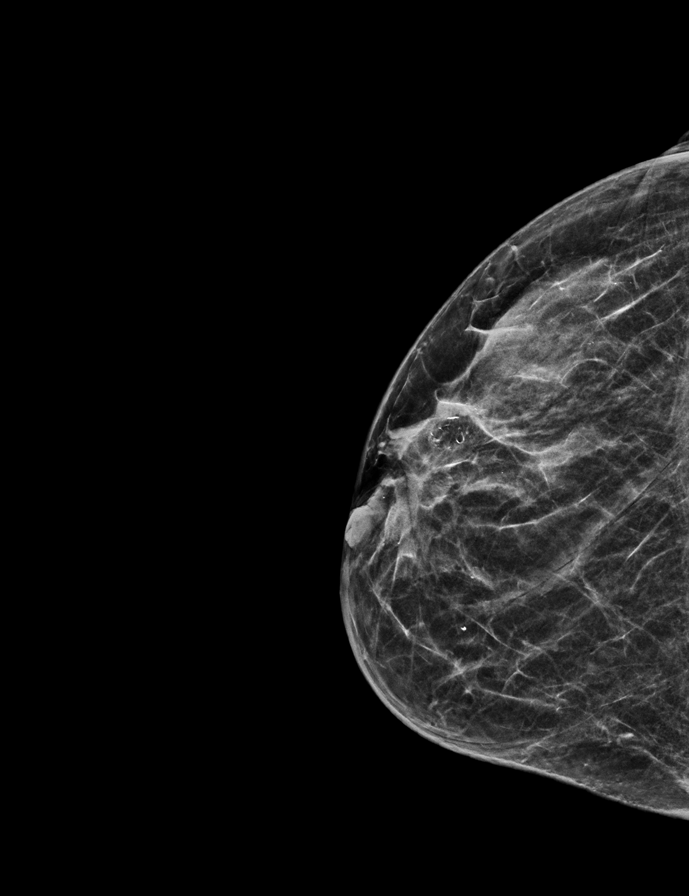

[R MLO tomo · tomo slice 17/34.0]
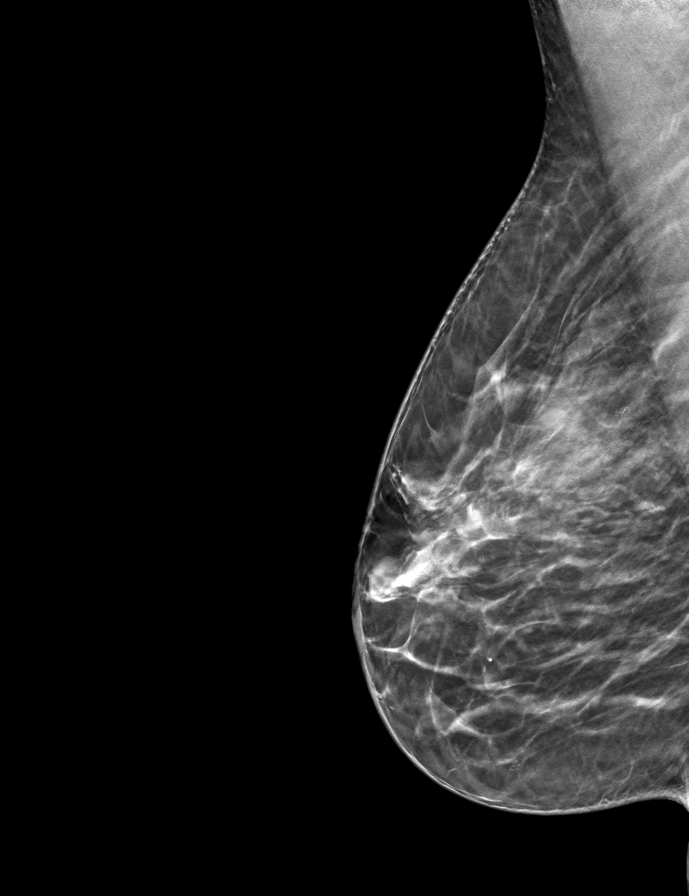

[L CC tomo · tomo slice 17/33.0]
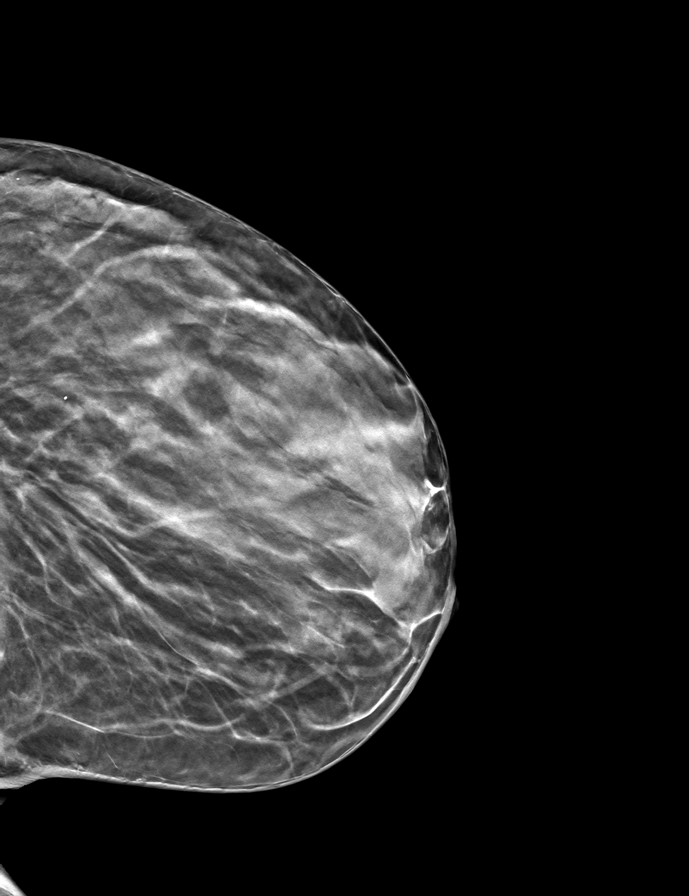

[L MLO tomo · tomo slice 17/33.0]
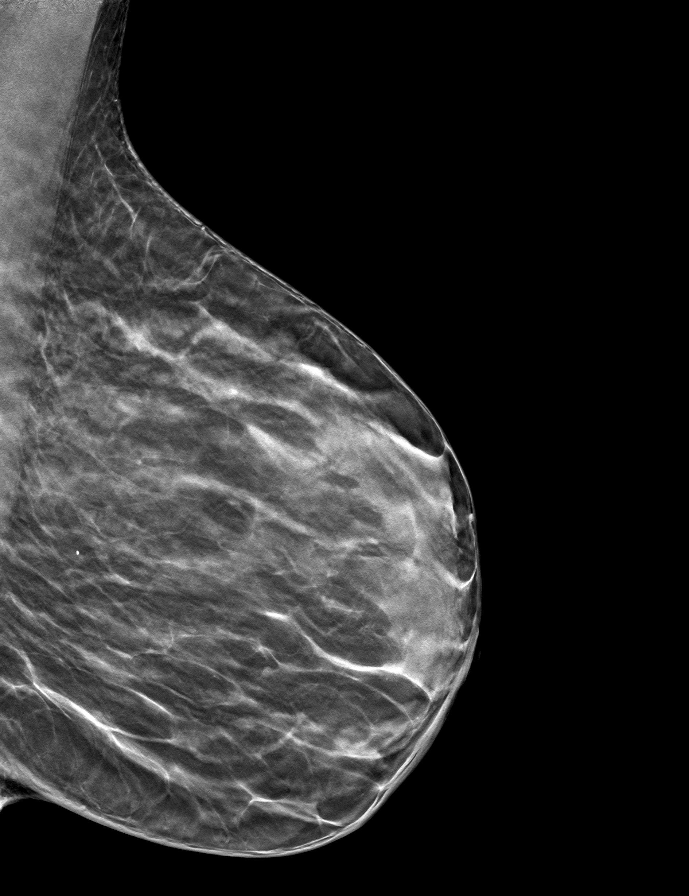

[R CC tomo · tomo slice 19/37.0]
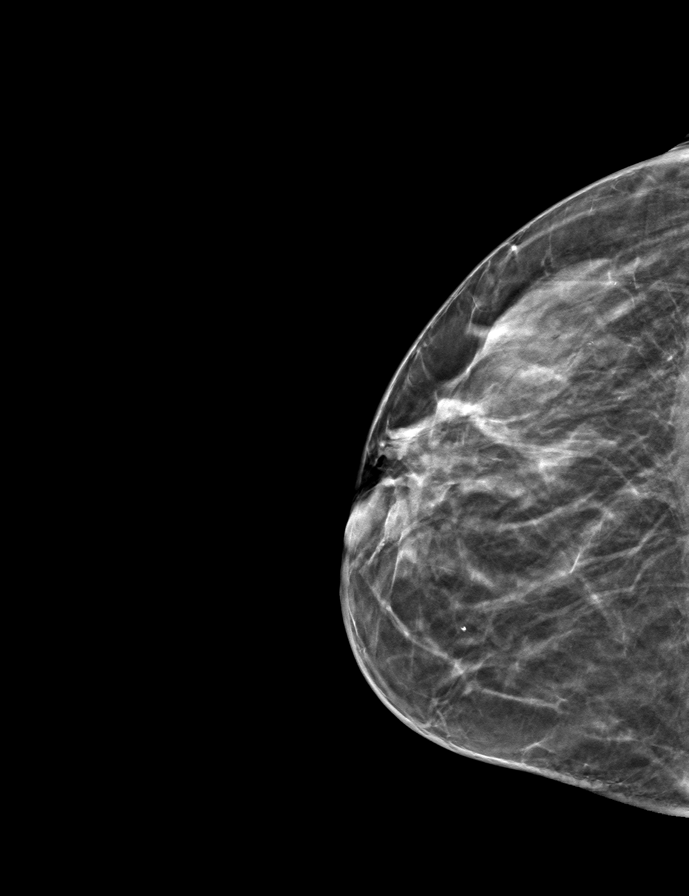

[9 of 25 positions shown; findings below may reference images not displayed]

ACR Breast Density Category c: The breast tissue is heterogeneously
dense, which may obscure small masses.
FINDINGS: Cc and MLO views of bilateral breasts, spot tangential view of right
breast are submitted. Postsurgical changes and changes fat necrosis
are identified in the right breast. No suspicious abnormalities
identified bilaterally.
IMPRESSION: Benign findings.

RECOMMENDATION:
Bilateral screening mammogram in 1 year.

I have discussed the findings and recommendations with the patient.
If applicable, a reminder letter will be sent to the patient
regarding the next appointment.

BI-RADS CATEGORY  2: Benign.

## 2022-05-07 ENCOUNTER — Other Ambulatory Visit: Payer: Self-pay

## 2022-05-07 DIAGNOSIS — Z1231 Encounter for screening mammogram for malignant neoplasm of breast: Secondary | ICD-10-CM

## 2022-07-29 ENCOUNTER — Ambulatory Visit: Payer: Self-pay | Admitting: Surgery

## 2022-09-30 ENCOUNTER — Ambulatory Visit
Admission: RE | Admit: 2022-09-30 | Discharge: 2022-09-30 | Disposition: A | Discharge status: Home / Self Care | Payer: BC Managed Care – PPO | Source: Ambulatory Visit | Attending: Surgery | Admitting: Surgery

## 2022-09-30 DIAGNOSIS — Z1231 Encounter for screening mammogram for malignant neoplasm of breast: Secondary | ICD-10-CM | POA: Insufficient documentation

## 2022-10-02 ENCOUNTER — Telehealth: Payer: Self-pay

## 2022-10-02 NOTE — Telephone Encounter (Signed)
Patient notified of mammogram results. Reminded to keep scheduled appointment with Dr.Pabon.
